# Patient Record
Sex: Female | Born: 1950 | Race: Black or African American | Hispanic: No | Smoking: Never smoker
Health system: Southern US, Community
[De-identification: ages and names within clinical notes are randomized; demographics above are authoritative.]

## PROBLEM LIST (undated history)

## (undated) DIAGNOSIS — E669 Obesity, unspecified: Secondary | ICD-10-CM

## (undated) DIAGNOSIS — M1990 Unspecified osteoarthritis, unspecified site: Secondary | ICD-10-CM

## (undated) DIAGNOSIS — Z87440 Personal history of urinary (tract) infections: Secondary | ICD-10-CM

## (undated) DIAGNOSIS — Z973 Presence of spectacles and contact lenses: Secondary | ICD-10-CM

## (undated) DIAGNOSIS — Z9289 Personal history of other medical treatment: Secondary | ICD-10-CM

## (undated) DIAGNOSIS — Z9889 Other specified postprocedural states: Secondary | ICD-10-CM

## (undated) DIAGNOSIS — J302 Other seasonal allergic rhinitis: Secondary | ICD-10-CM

## (undated) DIAGNOSIS — Z90710 Acquired absence of both cervix and uterus: Secondary | ICD-10-CM

## (undated) DIAGNOSIS — Z90722 Acquired absence of ovaries, bilateral: Secondary | ICD-10-CM

## (undated) DIAGNOSIS — B029 Zoster without complications: Secondary | ICD-10-CM

## (undated) HISTORY — DX: Unspecified osteoarthritis, unspecified site: M19.90

## (undated) HISTORY — DX: Acquired absence of ovaries, bilateral: Z90.722

## (undated) HISTORY — DX: Other specified postprocedural states: Z98.890

## (undated) HISTORY — PX: WISDOM TOOTH EXTRACTION: SHX21

## (undated) HISTORY — DX: Personal history of urinary (tract) infections: Z87.440

## (undated) HISTORY — DX: Obesity, unspecified: E66.9

## (undated) HISTORY — DX: Personal history of other medical treatment: Z92.89

## (undated) HISTORY — DX: Acquired absence of both cervix and uterus: Z90.710

## (undated) HISTORY — DX: Zoster without complications: B02.9

## (undated) HISTORY — DX: Other seasonal allergic rhinitis: J30.2

## (undated) HISTORY — DX: Presence of spectacles and contact lenses: Z97.3

---

## 1983-04-24 DIAGNOSIS — Z90722 Acquired absence of ovaries, bilateral: Secondary | ICD-10-CM

## 1983-04-24 HISTORY — DX: Acquired absence of ovaries, bilateral: Z90.722

## 1983-04-24 HISTORY — PX: VAGINAL HYSTERECTOMY: SUR661

## 1993-04-23 DIAGNOSIS — Z90710 Acquired absence of both cervix and uterus: Secondary | ICD-10-CM

## 1993-04-23 HISTORY — DX: Acquired absence of both cervix and uterus: Z90.710

## 1993-04-23 HISTORY — PX: CARDIAC CATHETERIZATION: SHX172

## 2003-04-24 DIAGNOSIS — Z9889 Other specified postprocedural states: Secondary | ICD-10-CM

## 2003-04-24 HISTORY — DX: Other specified postprocedural states: Z98.890

## 2005-02-16 ENCOUNTER — Ambulatory Visit: Payer: Self-pay | Admitting: Internal Medicine

## 2006-08-05 ENCOUNTER — Ambulatory Visit: Payer: Self-pay | Admitting: Gastroenterology

## 2007-01-28 ENCOUNTER — Emergency Department: Payer: Self-pay | Admitting: Emergency Medicine

## 2007-03-08 ENCOUNTER — Emergency Department: Payer: Self-pay | Admitting: Unknown Physician Specialty

## 2007-03-08 ENCOUNTER — Other Ambulatory Visit: Payer: Self-pay

## 2007-08-26 ENCOUNTER — Ambulatory Visit: Payer: Self-pay

## 2011-01-29 ENCOUNTER — Other Ambulatory Visit: Payer: Managed Care, Other (non HMO)

## 2011-02-05 ENCOUNTER — Encounter: Payer: Self-pay | Admitting: Internal Medicine

## 2011-02-28 ENCOUNTER — Encounter: Payer: Managed Care, Other (non HMO) | Admitting: Internal Medicine

## 2011-03-07 ENCOUNTER — Encounter: Payer: Managed Care, Other (non HMO) | Admitting: Internal Medicine

## 2011-03-31 LAB — HM MAMMOGRAPHY: HM Mammogram: NORMAL

## 2011-04-20 ENCOUNTER — Encounter: Payer: Self-pay | Admitting: Internal Medicine

## 2011-04-20 ENCOUNTER — Ambulatory Visit (INDEPENDENT_AMBULATORY_CARE_PROVIDER_SITE_OTHER): Payer: Managed Care, Other (non HMO) | Admitting: Internal Medicine

## 2011-04-20 DIAGNOSIS — I10 Essential (primary) hypertension: Secondary | ICD-10-CM | POA: Insufficient documentation

## 2011-04-20 DIAGNOSIS — E669 Obesity, unspecified: Secondary | ICD-10-CM

## 2011-04-20 DIAGNOSIS — R5383 Other fatigue: Secondary | ICD-10-CM

## 2011-04-20 DIAGNOSIS — Z1239 Encounter for other screening for malignant neoplasm of breast: Secondary | ICD-10-CM

## 2011-04-20 NOTE — Progress Notes (Signed)
  Subjective:    Patient ID: Sherry Lowe, female    DOB: [birth year], 60 y.o.   MRN: [id number]  HPI  Sherry Lowe is a 60 yr old  AA female with a history of hysterectomy presents with sinus congestion x 2 weeks.  She has clear discharge, no fevers. Taking OTC meds.  Her nasal passages have been and she has noted occasional  Streaks of blood with nose blowing .  Her other complaint is weight gain of 12 lbs in the last year despite cutting out sodas.  She is not exercising.  History reviewed. No pertinent past medical history.  No current outpatient prescriptions on file prior to visit.    Review of Systems  Constitutional: Negative for fever, chills and unexpected weight change.  HENT: Negative for hearing loss, ear pain, nosebleeds, congestion, sore throat, facial swelling, rhinorrhea, sneezing, mouth sores, trouble swallowing, neck pain, neck stiffness, voice change, postnasal drip, sinus pressure, tinnitus and ear discharge.   Eyes: Negative for pain, discharge, redness and visual disturbance.  Respiratory: Negative for cough, chest tightness, shortness of breath, wheezing and stridor.   Cardiovascular: Negative for chest pain, palpitations and leg swelling.  Musculoskeletal: Negative for myalgias and arthralgias.  Skin: Negative for color change and rash.  Neurological: Negative for dizziness, weakness, light-headedness and headaches.  Hematological: Negative for adenopathy.       Objective:   Physical Exam  Constitutional: She is oriented to person, place, and time. She appears well-developed and well-nourished.  HENT:  Mouth/Throat: Oropharynx is clear and moist.  Eyes: EOM are normal. Pupils are equal, round, and reactive to light. No scleral icterus.  Neck: Normal range of motion. Neck supple. No JVD present. No thyromegaly present.  Cardiovascular: Normal rate, regular rhythm, normal heart sounds and intact distal pulses.   Pulmonary/Chest: Effort normal and breath sounds normal.   Abdominal: Soft. Bowel sounds are normal. She exhibits no mass. There is no tenderness.  Musculoskeletal: Normal range of motion. She exhibits no edema.  Lymphadenopathy:    She has no cervical adenopathy.  Neurological: She is alert and oriented to person, place, and time.  Skin: Skin is warm and dry.  Psychiatric: She has a normal mood and affect.          Assessment & Plan:

## 2011-04-20 NOTE — Patient Instructions (Addendum)
The only way  To lose weight and keep it off is to start exercising with a goal of 20 minutes of aeoribic exercise 5 days per week  AND  to eat 6 smaller meals daily instead of 2 or 3.  This boost your metabolism and increases weight loss.  Restrict your starches to once or two servings daily will help  (bread, cereal, Pasta,  potatoes and rice, popcorn)   You should concentrate on salads, green vegetables,  A   lean protein sources  .  Nuts are an excellent source of protein and  fiber.    Dr Vickey Sages makes good protein bars that are  sufficient for one of your 6 meals daily   Drink water or G2 (gatorade without all the sugar)  Or Crystal Light  before a meal to fill up,  And try EAS protein shakes (Carb Control)  Premixed shakes for breakfast or the Atkins protein shakes   Try a sandwich thin toasted, with peanut butter spread on it, or for a sandwhich, find the pita bread or flatbread by [employer].    Return for fasting labs   The maximum weight you should aim for is loss of 4 lbs per month.   Return in 3 months   for your physical

## 2011-04-22 ENCOUNTER — Encounter: Payer: Self-pay | Admitting: Internal Medicine

## 2011-04-22 NOTE — Assessment & Plan Note (Signed)
Will screen for diabetes and thyroid deficiency.  Spent 30 minutes discussing diet and exercise.

## 2011-04-22 NOTE — Assessment & Plan Note (Signed)
Repeat bp was 126 /80.  She has been using OTC decongestants

## 2011-04-24 HISTORY — PX: COLONOSCOPY: SHX174

## 2011-05-04 ENCOUNTER — Other Ambulatory Visit: Payer: Self-pay | Admitting: Internal Medicine

## 2011-05-04 DIAGNOSIS — Z1239 Encounter for other screening for malignant neoplasm of breast: Secondary | ICD-10-CM

## 2011-05-07 ENCOUNTER — Ambulatory Visit
Admission: RE | Admit: 2011-05-07 | Discharge: 2011-05-07 | Disposition: A | Discharge status: Home / Self Care | Payer: Managed Care, Other (non HMO) | Source: Ambulatory Visit | Attending: Internal Medicine | Admitting: Internal Medicine

## 2011-05-07 ENCOUNTER — Inpatient Hospital Stay: Admission: RE | Admit: 2011-05-07 | Payer: Managed Care, Other (non HMO) | Source: Ambulatory Visit

## 2011-05-07 DIAGNOSIS — Z1239 Encounter for other screening for malignant neoplasm of breast: Secondary | ICD-10-CM

## 2011-07-24 ENCOUNTER — Other Ambulatory Visit: Payer: Managed Care, Other (non HMO)

## 2011-08-02 ENCOUNTER — Ambulatory Visit: Payer: Managed Care, Other (non HMO) | Admitting: Internal Medicine

## 2011-10-19 ENCOUNTER — Other Ambulatory Visit (INDEPENDENT_AMBULATORY_CARE_PROVIDER_SITE_OTHER): Payer: Managed Care, Other (non HMO) | Admitting: *Deleted

## 2011-10-19 DIAGNOSIS — E669 Obesity, unspecified: Secondary | ICD-10-CM

## 2011-10-19 DIAGNOSIS — R5383 Other fatigue: Secondary | ICD-10-CM

## 2011-10-19 LAB — LIPID PANEL
Cholesterol: 218 mg/dL — ABNORMAL HIGH (ref 0–200)
HDL: 79.1 mg/dL (ref >39.00)
Total CHOL/HDL Ratio: 3
Triglycerides: 68 mg/dL (ref 0.0–149.0)
VLDL: 13.6 mg/dL (ref 0.0–40.0)

## 2011-10-19 LAB — COMPREHENSIVE METABOLIC PANEL
ALT: 12 U/L (ref 0–35)
AST: 18 U/L (ref 0–37)
Albumin: 3.9 g/dL (ref 3.5–5.2)
Calcium: 9.3 mg/dL (ref 8.4–10.5)
Chloride: 108 mEq/L (ref 96–112)
Potassium: 4.4 mEq/L (ref 3.5–5.1)

## 2011-10-19 LAB — CBC WITH DIFFERENTIAL/PLATELET
Basophils Relative: 1 % (ref 0.0–3.0)
Eosinophils Absolute: 0.1 10*3/uL (ref 0.0–0.7)
HCT: 39.1 % (ref 36.0–46.0)
Lymphs Abs: 1.8 10*3/uL (ref 0.7–4.0)
MCHC: 33 g/dL (ref 30.0–36.0)
MCV: 84.5 fl (ref 78.0–100.0)
Monocytes Absolute: 0.5 10*3/uL (ref 0.1–1.0)
Neutrophils Relative %: 51.8 % (ref 43.0–77.0)
Platelets: 285 10*3/uL (ref 150.0–400.0)
RBC: 4.63 Mil/uL (ref 3.87–5.11)

## 2011-10-22 ENCOUNTER — Other Ambulatory Visit: Payer: Managed Care, Other (non HMO)

## 2011-10-29 ENCOUNTER — Ambulatory Visit (INDEPENDENT_AMBULATORY_CARE_PROVIDER_SITE_OTHER): Payer: Managed Care, Other (non HMO) | Admitting: Internal Medicine

## 2011-10-29 ENCOUNTER — Encounter: Payer: Self-pay | Admitting: Internal Medicine

## 2011-10-29 VITALS — BP 116/74 | HR 72 | Temp 97.6°F | Resp 16 | Wt 214.0 lb

## 2011-10-29 DIAGNOSIS — Z0000 Encounter for general adult medical examination without abnormal findings: Secondary | ICD-10-CM

## 2011-10-29 MED ORDER — FUROSEMIDE 20 MG PO TABS: ORAL_TABLET | ORAL | Status: DC

## 2011-10-29 NOTE — Assessment & Plan Note (Signed)
Breast exam and pelvic exam without PAP was done. Patient is s/p TAH/BSO.  She is up to date on colon ca and breast ca screening.

## 2011-10-29 NOTE — Patient Instructions (Addendum)
I have called in a mild diuretic for you to take AS NEEDED  In the morning for fluid retention.  You may not need it on the weekends.   Try to lose 4 lbs pe rmonth to reach your goal. By cutting out one starch per day  Remember to exercise for 30 minutes 3 to 5 times per week

## 2011-10-29 NOTE — Progress Notes (Signed)
Subjective:    Sherry Lowe is a 61 y.o. female who presents for an annual exam. The patient has no complaints today. The patient is sexually active. GYN screening history: last pap: approximate date 2011 and was normal. The patient wears seatbelts: no. The patient participates in regular exercise: no. Has the patient ever been transfused or tattooed?: no. The patient reports that there is not domestic violence in her life. Reporting lower extremity.   Menstrual History: OB History    Grav Para Term Preterm Abortions TAB SAB Ect Mult Living                  Menarche age: 37  No LMP recorded. Patient has had a hysterectomy.    The following portions of the patient's history were reviewed and updated as appropriate: allergies, current medications, past family history, past medical history, past social history, past surgical history and problem list.  Review of Systems A comprehensive review of systems was negative.    Objective:    BP 116/74  Pulse 72  Temp 97.6 F (36.4 C) (Oral)  Resp 16  Wt 214 lb (97.07 kg)  SpO2 96%  General Appearance:    Alert, cooperative, no distress, appears stated age  Head:    Normocephalic, without obvious abnormality, atraumatic  Eyes:    PERRL, conjunctiva/corneas clear, EOM's intact, fundi    benign, both eyes  Ears:    Normal TM's and external ear canals, both ears  Nose:   Nares normal, septum midline, mucosa normal, no drainage    or sinus tenderness  Throat:   Lips, mucosa, and tongue normal; teeth and gums normal  Neck:   Supple, symmetrical, trachea midline, no adenopathy;    thyroid:  no enlargement/tenderness/nodules; no carotid   bruit or JVD  Back:     Symmetric, no curvature, ROM normal, no CVA tenderness  Lungs:     Clear to auscultation bilaterally, respirations unlabored  Chest Wall:    No tenderness or deformity   Heart:    Regular rate and rhythm, S1 and S2 normal, no murmur, rub   or gallop  Breast Exam:    No tenderness,  masses, or nipple abnormality  Abdomen:     Soft, non-tender, bowel sounds active all four quadrants,    no masses, no organomegaly  Genitalia:    Normal female without lesion, discharge or tenderness  Rectal:    Normal tone, normal prostate, no masses or tenderness;   guaiac negative stool  Extremities:   Extremities normal, atraumatic, no cyanosis or edema  Pulses:   2+ and symmetric all extremities  Skin:   Skin color, texture, turgor normal, no rashes or lesions  Lymph nodes:   Cervical, supraclavicular, and axillary nodes normal  Neurologic:   CNII-XII intact, normal strength, sensation and reflexes    throughout  .    Assessment:    Healthy female exam.    Plan:    Routine general medical examination at a health care facility Breast exam and pelvic exam without PAP was done. Patient is s/p TAH/BSO.  She is up to date on colon ca and breast ca screening.  Breast self exam technique reviewed and patient encouraged to perform self-exam monthly.   Updated Medication List Outpatient Encounter Prescriptions as of 10/29/2011  Medication Sig Dispense Refill  . furosemide (LASIX) 20 MG tablet Daily as needed for fluid retention  30 tablet  3  . DISCONTD: dextromethorphan-guaiFENesin (MUCINEX DM) 30-600 MG per 12 hr tablet  Take 1 tablet by mouth every 12 (twelve) hours.

## 2012-04-23 DIAGNOSIS — B029 Zoster without complications: Secondary | ICD-10-CM

## 2012-04-23 HISTORY — DX: Zoster without complications: B02.9

## 2012-05-29 ENCOUNTER — Other Ambulatory Visit (INDEPENDENT_AMBULATORY_CARE_PROVIDER_SITE_OTHER): Payer: Self-pay | Admitting: General Surgery

## 2012-05-29 ENCOUNTER — Other Ambulatory Visit: Payer: Self-pay | Admitting: Family Medicine

## 2012-05-29 DIAGNOSIS — Z1231 Encounter for screening mammogram for malignant neoplasm of breast: Secondary | ICD-10-CM

## 2012-06-07 ENCOUNTER — Other Ambulatory Visit: Payer: Self-pay

## 2012-07-02 ENCOUNTER — Ambulatory Visit: Payer: Managed Care, Other (non HMO)

## 2013-02-26 ENCOUNTER — Other Ambulatory Visit: Payer: Self-pay

## 2013-03-23 ENCOUNTER — Ambulatory Visit (INDEPENDENT_AMBULATORY_CARE_PROVIDER_SITE_OTHER): Payer: BC Managed Care – PPO | Admitting: Medical

## 2013-03-23 ENCOUNTER — Encounter: Payer: Self-pay | Admitting: Medical

## 2013-03-23 VITALS — BP 120/82 | HR 80 | Temp 98.1°F | Resp 16 | Wt 217.0 lb

## 2013-03-23 DIAGNOSIS — R21 Rash and other nonspecific skin eruption: Secondary | ICD-10-CM

## 2013-03-23 DIAGNOSIS — L259 Unspecified contact dermatitis, unspecified cause: Secondary | ICD-10-CM

## 2013-03-23 MED ORDER — TRIAMCINOLONE ACETONIDE 0.1 % EX CREA: 1.0000 "application " | TOPICAL_CREAM | Freq: Two times a day (BID) | CUTANEOUS | Status: DC

## 2013-03-23 NOTE — Progress Notes (Signed)
  Subjective:   Early Ord is a 62 y.o. female who presents as a new patient today.  Was seeing Dr.Hiltz prior for primary care but he moved to [place] recently.  She is here for evaluation of a rash involving the leg, worried about shingles.  Was in normal state of health until 3 days ago with rash onset.  She does have hx/o chicken pox in remote past.  Rash started 3 days ago. Lesions are pink, and raised in texture. Rash has changed over time. Rash is pruritic. Associated symptoms: none. Patient denies: abdominal pain, arthralgia, congestion, cough, decrease in energy level, fever, headache, irritability, myalgia, nausea and vomiting. Patient has not had contacts with similar rash. Patient has not had new exposures.  Denies any recent illness.    The following portions of the patient's history were reviewed and updated as appropriate: allergies, current medications, past family history, past medical history, past social history and problem list.  Review of Systems As in subjective above   Objective:   Gen: wd, wn, nad Skin: left leg lateral knee joint line with patch of 4 raised papular to slightly vesicular round 2-22mm diameter lesions.  Smaller similar patch of papular/vesicular lesions upper posterolateral region in a different dermatome.   There is a smaller papule with surrounding whealed appearance of left upper anterolateral leg.  No induration, fluctuance, warmth.   MSK: nontender Ext: no edema   Assessment:   Encounter Diagnoses  Name Primary?  . Contact dermatitis Yes  . Rash and nonspecific skin eruption      Plan:   Dr. Joselyn Arrow, supervising physician also examined patient.  Reassured that rash doesn't appear to be shingles given the multiple dermatomes and appearance of the rash.  Discussed symptoms and exam findings.  Etiology most likely contact dermatitis vs insect bite.  Begin benadryl po OTC 2-3 times daily, triamcinolone cream topically, can use topical antibiotic  ointment for worsening redness.  Recheck if not improving in 3-4 days, if worse signs of infection as discussed or new symptoms .  Otherwise return prn.  We will request old chart since she is transferring care.

## 2013-03-27 ENCOUNTER — Ambulatory Visit (INDEPENDENT_AMBULATORY_CARE_PROVIDER_SITE_OTHER): Payer: BC Managed Care – PPO | Admitting: Medical

## 2013-03-27 ENCOUNTER — Encounter: Payer: Self-pay | Admitting: Medical

## 2013-03-27 VITALS — BP 116/74 | HR 60 | Temp 97.8°F | Resp 16 | Wt 219.0 lb

## 2013-03-27 DIAGNOSIS — R209 Unspecified disturbances of skin sensation: Secondary | ICD-10-CM

## 2013-03-27 DIAGNOSIS — R200 Anesthesia of skin: Secondary | ICD-10-CM

## 2013-03-27 DIAGNOSIS — B029 Zoster without complications: Secondary | ICD-10-CM

## 2013-03-27 DIAGNOSIS — R203 Hyperesthesia: Secondary | ICD-10-CM

## 2013-03-27 NOTE — Progress Notes (Signed)
  Subjective:   Sherry Lowe is a 62 y.o. female who presents for recheck. She saw me earlier in the week for a rash.   She came in for a rash of her left lower leg Monday, and after I and Dr. Lynelle Doctor both looked at the rash, it was thought that the rash could just be allergic versus insect bite, although shingles was considered. However, although the rash is now scabbing over, she has numbness in the left lateral foot only times one day.  She denies other numbness, tingling, weakness, no joint pain, muscle pain, no fever, no nausea or vomiting, no diarrhea.  No speech changes, no vision or hearing changes, no chest pain. Denies foot drop. The rash started about 5 days ago, with small blisters along her left lower lateral leg. Since the last visit she has been using the triamcinolone cream topically, Benadryl by mouth. No other complaint.    The following portions of the patient's history were reviewed and updated as appropriate: allergies, current medications, past family history, past medical history, past social history and problem list.  Review of Systems As in subjective above   Objective:   Gen: wd, wn, nad Skin: left leg lateral knee yearly to distal third of the lateral leg with several singular crusted lesions, all seeming to be within S1 dermatome. Some of the lesions are slightly tender to touch, but she seems to be sensitive to even light touch along her lateral lower leg. MSK: Otherwise nontender, no joint swelling Pulses normal lower extremities Ext: no edema Neuro: Decreased sensation to light and sharp touch of her left lateral edge of the left foot only, this is an S1 distribution, otherwise sensation, strength, DTRs normal lower extremities, rest of neurological exam nonfocal   Assessment:   Encounter Diagnoses  Name Primary?  . Shingles outbreak Yes  . Numbness of foot   . Hyperesthesia      Plan:   Dr. Susann Givens supervising physician also examined patient. At this point  it would appear that this is more likely shingles than anything else, particularly given the appearance of the crusting lesions and new neurological findings on exam .  Discussed diagnosis of shingles, usual course of illness, possibility of post viral neuralgia. It is too late to begin antivirals, advise Aleve for the next few days, 2 tablets twice daily, relative rest, can continue triamcinolone cream. Discussed preventative measures, preventing spread of disease, possibility of recurrence, And possible complications. Answered her questions. Followup when necessary.

## 2013-04-07 ENCOUNTER — Telehealth: Payer: Self-pay | Admitting: Medical

## 2013-04-07 ENCOUNTER — Other Ambulatory Visit: Payer: Self-pay | Admitting: Medical

## 2013-04-07 MED ORDER — TRIAMCINOLONE ACETONIDE 0.1 % EX CREA: 1.0000 "application " | TOPICAL_CREAM | Freq: Two times a day (BID) | CUTANEOUS | Status: DC

## 2013-04-07 NOTE — Telephone Encounter (Signed)
rx sent

## 2013-04-07 NOTE — Telephone Encounter (Signed)
Pt needs refill on kenalog. She states she is much better but still has some places that are not completely well. Pt uses [facility] on [address] in 

## 2013-04-07 NOTE — Telephone Encounter (Signed)
Pt informed.lm

## 2013-06-16 ENCOUNTER — Ambulatory Visit: Payer: BC Managed Care – PPO | Admitting: Family Medicine

## 2013-06-19 ENCOUNTER — Ambulatory Visit (INDEPENDENT_AMBULATORY_CARE_PROVIDER_SITE_OTHER): Payer: BC Managed Care – PPO | Admitting: Medical

## 2013-06-19 ENCOUNTER — Encounter: Payer: Self-pay | Admitting: Medical

## 2013-06-19 VITALS — BP 122/80 | HR 80 | Temp 98.0°F | Wt 221.0 lb

## 2013-06-19 DIAGNOSIS — R300 Dysuria: Secondary | ICD-10-CM

## 2013-06-19 DIAGNOSIS — R3915 Urgency of urination: Secondary | ICD-10-CM

## 2013-06-19 LAB — POCT URINALYSIS DIPSTICK
Bilirubin, UA: NEGATIVE
GLUCOSE UA: NEGATIVE
KETONES UA: NEGATIVE
Nitrite, UA: NEGATIVE
Protein, UA: 5
SPEC GRAV UA: 1.01
UROBILINOGEN UA: NEGATIVE
pH, UA: 5

## 2013-06-19 MED ORDER — AMOXICILLIN 875 MG PO TABS: 875.0000 mg | ORAL_TABLET | Freq: Two times a day (BID) | ORAL | Status: DC

## 2013-06-19 NOTE — Patient Instructions (Signed)

## 2013-06-19 NOTE — Progress Notes (Signed)
Subjective:  Sherry Lowe is a 63 y.o. female who complains of possible urinary tract infection.  She has had symptoms for 1 day.  Symptoms include bladder pressure and urgency and frequency.  Patient denies urinary pressure.  Denies fever, burning, blood, no cloudy or odor, no back pain.  Last UTI was over a year ago.   Using cranberry juice for current symptoms.  Patient does have a history of recurrent UTI, on average once yearly.  Patient does not have a history of pyelonephritis.  Has used amoxicillin in the past for UTI. No other aggravating or relieving factors.  No other c/o.  History reviewed. No pertinent past medical history.  ROS as in subjective  Reviewed allergies, medications, past medical, surgical, and social history.    Objective: Filed Vitals:   06/19/13 1105  BP: 122/80  Pulse: 80  Temp: 98 F (36.7 C)    General appearance: alert, no distress, WD/WN, female Abdomen: +bs, soft, non tender, non distended, no masses, no hepatomegaly, no splenomegaly, no bruits Back: no CVA tenderness GU: deferred     Laboratory:  Urine dipstick: moderate leuk, moderate blood, SG: 1.010.      Assessment: Encounter Diagnoses  Name Primary?  . Urinary urgency Yes  . Dysuria      Plan: Discussed symptoms, diagnosis, possible complications, and usual course of illness.  Begin medication Amoxicillin per orders below.  Advised increased water intake, can use OTC Tylenol for pain.        Urine culture sent.  Call or return if worse or not improving.

## 2013-06-22 LAB — URINE CULTURE

## 2013-08-17 ENCOUNTER — Encounter: Payer: BC Managed Care – PPO | Admitting: Medical

## 2013-09-15 ENCOUNTER — Ambulatory Visit (INDEPENDENT_AMBULATORY_CARE_PROVIDER_SITE_OTHER): Payer: BC Managed Care – PPO | Admitting: Medical

## 2013-09-15 ENCOUNTER — Encounter: Payer: Self-pay | Admitting: Medical

## 2013-09-15 VITALS — BP 120/80 | HR 60 | Temp 98.0°F | Resp 14 | Wt 218.0 lb

## 2013-09-15 DIAGNOSIS — R202 Paresthesia of skin: Secondary | ICD-10-CM

## 2013-09-15 DIAGNOSIS — Z8619 Personal history of other infectious and parasitic diseases: Secondary | ICD-10-CM

## 2013-09-15 DIAGNOSIS — M79671 Pain in right foot: Secondary | ICD-10-CM

## 2013-09-15 DIAGNOSIS — M722 Plantar fascial fibromatosis: Secondary | ICD-10-CM

## 2013-09-15 DIAGNOSIS — R209 Unspecified disturbances of skin sensation: Secondary | ICD-10-CM

## 2013-09-15 DIAGNOSIS — M79609 Pain in unspecified limb: Secondary | ICD-10-CM

## 2013-09-15 NOTE — Progress Notes (Signed)
   Subjective:   Sherry Lowe is a 63 y.o. female presenting on 09/15/2013 with bilateral feet pain  She was seen here in December for shingles.  She still has numbness of left foot unchanged since onset of shingles.  No pain thought.  Years ago had to wear brace of left foot, stress fracture, but no numbness til recently.  Having new c/o right heel pain x 10 days, worsening pain.  Pain is mostly back of heel.  Hurts worse when first getting up in the mornings.  The longer she walks on it the better it feels.  No prior similar.  Works in [employer], sits most of the time.   Walks for exercise - 3 times weekly, no heel pain with this so far.  No other aggravating or relieving factors.  No other complaint.  Review of Systems ROS as in subjective      Objective:    Filed Vitals:   09/15/13 1413  BP: 120/80  Pulse: 60  Temp: 98 F (36.7 C)  Resp: 14    General appearance: alert, no distress, WD/WN MSK: right plantar fascia and heel tenderness, otherwise feet nontender, normal ROM of feet and toes, no deformity Pedal pulses normal Neuro: left latera foot with decreased sensation throughout, otherwise feet/leg sensation, strength, and DTRs normal Ext: no edema      Assessment: Encounter Diagnoses  Name Primary?  . Pain of right heel Yes  . Plantar fasciitis of right foot   . Paresthesias   . History of shingles      Plan: Pain of right heel, plantar fascitis of right foot - discussed stretching, ice, foot splint for bedtime.  F/u 20mo  Paresthesias, hx/o shingles - discussed likelihood of this being neuropathy related to shingles infection.  No pain, no obvious post herpetic neuralgia.  Will call back after discussing case with supervising physician.  Sherry Lowe was seen today for bilateral feet pain.  Diagnoses and associated orders for this visit:  Pain of right heel  Plantar fasciitis of right foot  Paresthesias  History of shingles    Return pending  call back.

## 2013-09-15 NOTE — Patient Instructions (Addendum)
  Thank you for giving me the opportunity to serve you today.    Your diagnosis today includes: Encounter Diagnoses  Name Primary?  . Pain of right heel Yes  . Plantar fasciitis of right foot      Specific recommendations today include:  Begin daily foot stretch and tennis ball massage  Consider 90 degree foot splint at bedtime  Can use OTC Aleve or Ibuprofen if needed  I will let you know about the numbness - next steps  Follow up pending call back.   I have included other useful information below for your review.  Plantar Fasciitis Plantar fasciitis is a common condition that causes foot pain. It is soreness (inflammation) of the band of tough fibrous tissue on the bottom of the foot that runs from the heel bone (calcaneus) to the ball of the foot. The cause of this soreness may be from excessive standing, poor fitting shoes, running on hard surfaces, being overweight, having an abnormal walk, or overuse (this is common in runners) of the painful foot or feet. It is also common in aerobic exercise dancers and ballet dancers. SYMPTOMS  Most people with plantar fasciitis complain of:  Severe pain in the morning on the bottom of their foot especially when taking the first steps out of bed. This pain recedes after a few minutes of walking.  Severe pain is experienced also during walking following a long period of inactivity.  Pain is worse when walking barefoot or up stairs DIAGNOSIS   Your caregiver will diagnose this condition by examining and feeling your foot.  Special tests such as X-rays of your foot, are usually not needed. PREVENTION   Consult a sports medicine professional before beginning a new exercise program.  Walking programs offer a good workout. With walking there is a lower chance of overuse injuries common to runners. There is less impact and less jarring of the joints.  Begin all new exercise programs slowly. If problems or pain develop, decrease the  amount of time or distance until you are at a comfortable level.  Wear good shoes and replace them regularly.  Stretch your foot and the heel cords at the back of the ankle (Achilles tendon) both before and after exercise.  Run or exercise on even surfaces that are not hard. For example, asphalt is better than pavement.  Do not run barefoot on hard surfaces.  If using a treadmill, vary the incline.  Do not continue to workout if you have foot or joint problems. Seek professional help if they do not improve. HOME CARE INSTRUCTIONS   Avoid activities that cause you pain until you recover.  Use ice or cold packs on the problem or painful areas after working out.  Only take over-the-counter or prescription medicines for pain, discomfort, or fever as directed by your caregiver.  Soft shoe inserts or athletic shoes with air or gel sole cushions may be helpful.  If problems continue or become more severe, consult a sports medicine caregiver or your own health care provider. Cortisone is a potent anti-inflammatory medication that may be injected into the painful area. You can discuss this treatment with your caregiver. MAKE SURE YOU:   Understand these instructions.  Will watch your condition.  Will get help right away if you are not doing well or get worse. Document Released: 01/02/2001 Document Revised: 07/02/2011 Document Reviewed: 03/03/2008 [facility] Patient Information 2014 [place], [place].

## 2013-09-17 ENCOUNTER — Telehealth: Payer: Self-pay | Admitting: Medical

## 2013-09-17 NOTE — Telephone Encounter (Signed)
After discussing her case with the other doctors here, we do recommend going ahead and getting a nerve conduction study. Please set this up at the hospital, not across the [facility]

## 2013-09-18 NOTE — Telephone Encounter (Signed)
LMOM TO CB. CLS 

## 2013-09-21 ENCOUNTER — Other Ambulatory Visit: Payer: Self-pay | Admitting: Family Medicine

## 2013-09-21 DIAGNOSIS — M722 Plantar fascial fibromatosis: Secondary | ICD-10-CM

## 2013-09-21 DIAGNOSIS — R209 Unspecified disturbances of skin sensation: Secondary | ICD-10-CM

## 2013-09-21 NOTE — Telephone Encounter (Signed)
I am working her referral for nerve conduction study. CLS

## 2013-10-07 ENCOUNTER — Other Ambulatory Visit: Payer: Self-pay | Admitting: Family Medicine

## 2013-10-07 DIAGNOSIS — R209 Unspecified disturbances of skin sensation: Secondary | ICD-10-CM

## 2013-11-09 ENCOUNTER — Ambulatory Visit: Payer: Self-pay | Admitting: Neurology

## 2013-12-02 ENCOUNTER — Ambulatory Visit (INDEPENDENT_AMBULATORY_CARE_PROVIDER_SITE_OTHER): Payer: BC Managed Care – PPO | Admitting: Neurology

## 2013-12-02 ENCOUNTER — Encounter: Payer: Self-pay | Admitting: Neurology

## 2013-12-02 VITALS — BP 138/70 | HR 68 | Temp 98.0°F | Resp 18 | Ht 66.0 in | Wt 221.2 lb

## 2013-12-02 DIAGNOSIS — R209 Unspecified disturbances of skin sensation: Secondary | ICD-10-CM

## 2013-12-02 DIAGNOSIS — R202 Paresthesia of skin: Secondary | ICD-10-CM

## 2013-12-02 NOTE — Patient Instructions (Signed)
1.  We will perform electrodiagnostic testing of the left > right lower extremity to better clarify the nature of your symptoms. 2.  Return to clinic in 802-months to discuss results  ELECTROMYOGRAM AND NERVE CONDUCTION STUDIES (EMG/NCS) INSTRUCTIONS  How to Prepare The neurologist conducting the EMG will need to know if you have certain medical conditions. Tell the neurologist and other EMG lab personnel if you:   Have a pacemaker or any other electrical medical device   Take blood-thinning medications   Have hemophilia, a blood-clotting disorder that causes prolonged bleeding Bathing Take a shower or bath shortly before your exam in order to remove oils from your skin. Don't apply lotions or creams before the exam.  What to Expect You'll likely be asked to change into a hospital gown for the procedure and lie down on an examination table. The following explanations can help you understand what will happen during the exam.    Electrodes. The neurologist or a technician places surface electrodes at various locations on your skin depending on where you're experiencing symptoms. Or the neurologist may insert needle electrodes at different sites depending on your symptoms.    Sensations. The electrodes will at times transmit a tiny electrical current that you may feel as a twinge or spasm. The needle electrode may cause discomfort or pain that usually ends shortly after the needle is removed. If you are concerned about discomfort or pain, you may want to talk to the neurologist about taking a short break during the exam.    Instructions. During the needle EMG, the neurologist will assess whether there is any spontaneous electrical activity when the muscle is at rest - activity that isn't present in healthy muscle tissue - and the degree of activity when you slightly contract the muscle.  He or she will give you instructions on resting and contracting a muscle at appropriate times. Depending on what  muscles and nerves the neurologist is examining, he or she may ask you to change positions during the exam.  After your EMG You may experience some temporary, minor bruising where the needle electrode was inserted into your muscle. This bruising should fade within several days. If it persists, contact your primary care doctor.

## 2013-12-02 NOTE — Progress Notes (Signed)
[facility]rology [facility] Clinic Note - Initial Visit   Date: 12/02/2013  Sherry Lowe MRN: [id number] DOB: [birth year]   Dear Dr. Aleen Campi:  Thank you for your kind referral of Sherry Lowe for consultation of left lateral foot numbness. Although her history is well known to you, please allow [place] to reiterate it for the purpose of our medical record. The patient was accompanied to the clinic by self.   who also provides collateral information.     History of Present Illness: Sherry Lowe is a 63 y.o. right-handed African American female with history of shingles (December 2014) presenting for evaluation of numbness of the left leg.    Around December 2014, she developed a rash over the left buttocks and posterior knee.  Within a few weeks, she developed numbness over the left lateral foot. She does not notice that it is there and it is not bothersome, but symptoms have been persistent since onset without improvement.  Numbness does not involve her lower leg or where her shingles occurred.  She denies any associated tingling or back pain.  No associated numbnessof the right foot.    Past Medical History  Diagnosis Date  . Shingles     Past Surgical History  Procedure Laterality Date  . Total abdominal hysterectomy       Medications:  Current Outpatient Prescriptions on File Prior to Visit  Medication Sig Dispense Refill  . aspirin 81 MG tablet Take 81 mg by mouth daily.       No current facility-administered medications on file prior to visit.    Allergies:  Allergies  Allergen Reactions  . Sulfa Drugs Cross Reactors Itching  . Zithromax [Azithromycin]     Family History: Family History  Problem Relation Age of Onset  . Hyperlipidemia Father     Living, 58  . Cancer Father     prostate  . Hyperlipidemia Mother     Living 55  . Hypertension Father   . Healthy Brother     x2    Social History: History   Social History  . Marital Status:  Single    Spouse Name: N/A    Number of Children: N/A  . Years of Education: N/A   Occupational History  . Not on file.   Social History Main Topics  . Smoking status: Never Smoker   . Smokeless tobacco: Never Used  . Alcohol Use: No  . Drug Use: No  . Sexual Activity: Yes    Partners: Male   Other Topics Concern  . Not on file   Social History Narrative   She lives alone.  She has one grown son.   She works as a Arts development officer as a Acupuncturist.   Highest level of education:  B.S.    Review of Systems:  CONSTITUTIONAL: No fevers, chills, night sweats, or weight loss.   EYES: No visual changes or eye pain ENT: No hearing changes.  No history of nose bleeds.   RESPIRATORY: No cough, wheezing and shortness of breath.   CARDIOVASCULAR: Negative for chest pain, and palpitations.   GI: Negative for abdominal discomfort, blood in stools or black stools.  No recent change in bowel habits.   GU:  No history of incontinence.   MUSCLOSKELETAL: No history of joint pain or swelling.  No myalgias.   SKIN: Negative for lesions, rash, and itching.   HEMATOLOGY/ONCOLOGY: Negative for prolonged bleeding, bruising easily, and swollen nodes.    ENDOCRINE: Negative  for cold or heat intolerance, polydipsia or goiter.   PSYCH:  No depression or anxiety symptoms.   NEURO: As Above.   Vital Signs:  BP 138/70  Pulse 68  Temp(Src) 98 F (36.7 C)  Resp 18  Ht 5\' 6"  (1.676 m)  Wt 221 lb 3.2 oz (100.336 kg)  BMI 35.72 kg/m2 Pain Scale: 0 on a scale of 0-10   General Medical Exam:   General:  Well appearing, comfortable.   Eyes/ENT: see cranial nerve examination.   Neck: No masses appreciated.  Full range of motion without tenderness.  No carotid bruits. Respiratory:  Clear to auscultation, good air entry bilaterally.   Cardiac:  Regular rate and rhythm, no murmur.   Extremities:  No deformities, edema, or skin discoloration. Good capillary refill.   Skin: Old scarred lesions  over the left posterior thigh and popliteal fossa.  Neurological Exam: MENTAL STATUS including orientation to time, place, person, recent and remote memory, attention span and concentration, language, and fund of knowledge is normal.  Speech is not dysarthric.  CRANIAL NERVES: II:  No visual field defects.  Unremarkable fundi.   III-IV-VI: Pupils equal round and reactive to light.  Normal conjugate, extra-ocular eye movements in all directions of gaze.  No nystagmus.  No ptosis.   V:  Normal facial sensation.   VII:  Normal facial symmetry and movements.   VIII:  Normal hearing and vestibular function.   IX-X:  Normal palatal movement.   XI:  Normal shoulder shrug and head rotation.   XII:  Normal tongue strength and range of motion, no deviation or fasciculation.  MOTOR:  No atrophy, fasciculations or abnormal movements.  No pronator drift.  Tone is normal.    Right Upper Extremity:    Left Upper Extremity:    Deltoid  5/5   Deltoid  5/5   Biceps  5/5   Biceps  5/5   Triceps  5/5   Triceps  5/5   Wrist extensors  5/5   Wrist extensors  5/5   Wrist flexors  5/5   Wrist flexors  5/5   Finger extensors  5/5   Finger extensors  5/5   Finger flexors  5/5   Finger flexors  5/5   Dorsal interossei  5/5   Dorsal interossei  5/5   Abductor pollicis  5/5   Abductor pollicis  5/5   Tone ([place] scale)  0  Tone ([place]worth scale)  0   Right Lower Extremity:    Left Lower Extremity:    Hip flexors  5/5   Hip flexors  5/5   Hip extensors  5/5   Hip extensors  5/5   Knee flexors  5/5   Knee flexors  5/5   Knee extensors  5/5   Knee extensors  5/5   Dorsiflexors  5/5   Dorsiflexors  5/5   Plantarflexors  5/5   Plantarflexors  5/5   Toe extensors  5/5   Toe extensors  5/5   Toe flexors  5/5   Toe flexors  5/5   Tone ([place]worth scale)  0  Tone ([place]worth scale)  0   MSRs:  Right  Left brachioradialis 2+  brachioradialis 2+  biceps  2+  biceps 2+  triceps 2+  triceps 2+  patellar 1+  Patellar 1+  ankle jerk 0  ankle jerk 0  Hoffman no  Hoffman no  plantar response down  plantar response down   SENSORY:  Reduced pin prick over the left lateral foot. Otherwise, normal and symmetric perception of light touch, pinprick, vibration, and proprioception.  Romberg's sign absent.   COORDINATION/GAIT: Normal finger-to- nose-finger and heel-to-shin.  Intact rapid alternating movements bilaterally.  Able to rise from a chair without using arms.  Gait narrow based and stable.   Data: Lab Results  Component Value Date   CHOL 218* 10/19/2011   HDL 79.10 10/19/2011   LDLDIRECT 120.7 10/19/2011   TRIG 68.0 10/19/2011   CHOLHDL 3 10/19/2011     IMPRESSION: Ms. Squibb is a delightful 63 year old female with history of shingles affecting the left S1 dermatome presenting with left lateral foot numbness. Her examination shows reduced sensation over the left lateral foot with absent reflexes at the ankles bilaterally. Based on her history and exam, her paresthesias most likely residual effects of shingles. It is atypical that her symptoms are predominantly numbness, since shingles tends to have neuropathic pain associated with it, which she denies.  I will order electrodiagnostic testing to better clarify the nature of her symptoms, since the sural sensory nerve also shares the same cutaneous distribution.   Return to clinic in 2 months.    The duration of this appointment visit was 40 minutes of face-to-face time with the patient.  Greater than 50% of this time was spent in counseling, explanation of diagnosis, planning of further management, and coordination of care.   Thank you for allowing me to participate in patient's care.  If I can answer any additional questions, I would be pleased to do so.    Sincerely,    Donika K. Allena Katz, DO

## 2014-01-18 ENCOUNTER — Ambulatory Visit (INDEPENDENT_AMBULATORY_CARE_PROVIDER_SITE_OTHER): Payer: BC Managed Care – PPO | Admitting: Neurology

## 2014-01-18 DIAGNOSIS — R209 Unspecified disturbances of skin sensation: Secondary | ICD-10-CM

## 2014-01-18 DIAGNOSIS — R202 Paresthesia of skin: Secondary | ICD-10-CM

## 2014-01-18 NOTE — Procedures (Signed)
[facility]  [address], [address]  [place], [place] [address] Tel: [phone] Fax:  [phone] Test Date:  01/18/2014  Patient: Sherry Lowe DOB: [birth year] Physician: Nita Sickle  Sex: Female Height:  Ref Phys: Nita Sickle  ID#: [id number] Temp: 31.0C Technician: Ala Bent R. NCS T.   Patient Complaints: Patient is a 63 year old female for evaluation lateral numbness of left foot since December of 2014.  NCV & EMG Findings: Extensive electrodiagnostic testing of the left lower extremity and additional studies of the right reveals:  1. Bilateral sural and superficial peroneal sensory responses are within normal limits.  2. The left peroneal motor response recording at the extensor digitorum brevis is borderline reduced, however normal when recording at the tibialis anterior. Bilateral tibial and the right peroneal motor responses are within normal limits.  3. Very sparse chronic motor axon loss changes are seen affecting the medial gastrocnemius and biceps femoris short head muscles, without accompanied active changes.  4. H-reflex studies indicate that the left tibial H-reflex has prolonged latency (36.60 ms).     Impression: 1. The electrophysiologic findings are most suggestive of a chronic left S1 radiculopathy, very mild in degree electrically.  2. There is no evidence of a generalized sensorimotor polyneuropathy affecting the legs.   ___________________________ Nita Sickle    Nerve Conduction Studies Anti Sensory Summary Table   Stim Site NR Peak (ms) Norm Peak (ms) P-T Amp (V) Norm P-T Amp  Left Sup Peroneal Anti Sensory (Ant Lat Mall)  12 cm    2.7 <4.6 6.7 >3  Right Sup Peroneal Anti Sensory (Ant Lat Mall)  31C  12 cm    2.7 <4.6 5.9 >3  Left Sural Anti Sensory (Lat Mall)  Calf    4.2 <4.6 3.1 >3  Right Sural Anti Sensory (Lat Mall)  31C  Calf    3.2 <4.6 3.9 >3   Motor Summary Table   Stim Site NR Onset (ms) Norm Onset (ms) O-P  Amp (mV) Norm O-P Amp Site1 Site2 Delta-0 (ms) Dist (cm) Vel (m/s) Norm Vel (m/s)  Left Peroneal Motor (Ext Dig Brev)  31C  Ankle    3.8 <6.0 2.0 >2.5 B Fib Ankle 7.0 32.0 46 >40  B Fib    10.8  1.8  Poplt B Fib 1.8 10.0 56 >40  Poplt    12.6  1.3         Right Peroneal Motor (Ext Dig Brev)  31C  Ankle    3.3 <6.0 3.2 >2.5 B Fib Ankle 6.9 32.5 47 >40  B Fib    10.2  2.5  Poplt B Fib 1.6 8.5 53 >40  Poplt    11.8  2.5         Left Peroneal TA Motor (Tib Ant)  31C  Fib Head    2.5 <4.5 4.9 >3 Poplit Fib Head 1.9 10.0 53 >40  Poplit    4.4  3.7         Right Peroneal TA Motor (Tib Ant)  31C  Fib Head    2.7 <4.5 4.3 >3 Poplit Fib Head 1.3 8.0 62 >40  Poplit    4.0  4.1         Left Tibial Motor (Abd Hall Brev)  31C  Ankle    3.9 <6.0 5.0 >4 Knee Ankle 8.3 36.0 43 >40  Knee    12.2  4.7         Right Tibial Motor (Abd  Hall Brev)  31C  Ankle    3.8 <6.0 4.3 >4 Knee Ankle 7.5 38.0 51 >40  Knee    11.3  3.8          F Wave Studies   NR F-Lat (ms) Lat Norm (ms) L-R F-Lat (ms)  Left Tibial (Mrkrs) (Abd Hallucis)  31C     54.27 <55 4.57  Right Tibial (Mrkrs) (Abd Hallucis)  31C     49.70 <55 4.57   H Reflex Studies   NR H-Lat (ms) Lat Norm (ms) L-R H-Lat (ms)  Left Tibial (Gastroc)  31C     36.60 <35 1.63  Right Tibial (Gastroc)  31C     34.97 <35 1.63   EMG   Side Muscle Ins Act Fibs Psw Fasc Number Recrt Dur Dur. Amp Amp. Poly Poly. Comment  Left AntTibialis Nml Nml Nml Nml Nml Nml Nml Nml Nml Nml Nml Nml N/A  Left Gastroc Nml Nml Nml Nml 1- Mod-V Few 1+ Nml Nml Nml Nml N/A  Left Flex Dig Long Nml Nml Nml Nml Nml Nml Nml Nml Nml Nml Nml Nml N/A  Left RectFemoris Nml Nml Nml Nml Nml Nml Nml Nml Nml Nml Nml Nml N/A  Left BicepsFemS Nml Nml Nml Nml 1- Mod Few 1+ Nml Nml Nml Nml N/A      Waveforms:

## 2014-01-18 NOTE — Progress Notes (Addendum)
EMG 01/18/2014  1. The electrophysiologic findings are most suggestive of a chronic left S1 radiculopathy, very mild in degree electrically.  2. There is no evidence of a generalized sensorimotor polyneuropathy affecting the legs.  Results discussed with patient that numbness is likely from S1 radiculopathy, possibly from shingles nerve irritation.  She will contact us if symptoms change or worsen.  Sherry Lowe K. Allena Katz, DO

## 2014-02-03 ENCOUNTER — Ambulatory Visit: Payer: BC Managed Care – PPO | Admitting: Neurology

## 2014-02-25 ENCOUNTER — Encounter: Payer: Self-pay | Admitting: Medical

## 2014-02-25 ENCOUNTER — Ambulatory Visit (INDEPENDENT_AMBULATORY_CARE_PROVIDER_SITE_OTHER): Payer: BC Managed Care – PPO | Admitting: Medical

## 2014-02-25 ENCOUNTER — Telehealth: Payer: Self-pay | Admitting: Medical

## 2014-02-25 VITALS — BP 130/80 | HR 68 | Temp 97.8°F | Resp 16 | Ht 67.0 in | Wt 218.0 lb

## 2014-02-25 DIAGNOSIS — Z0000 Encounter for general adult medical examination without abnormal findings: Secondary | ICD-10-CM

## 2014-02-25 DIAGNOSIS — Z113 Encounter for screening for infections with a predominantly sexual mode of transmission: Secondary | ICD-10-CM

## 2014-02-25 DIAGNOSIS — Z1239 Encounter for other screening for malignant neoplasm of breast: Secondary | ICD-10-CM

## 2014-02-25 DIAGNOSIS — E669 Obesity, unspecified: Secondary | ICD-10-CM

## 2014-02-25 DIAGNOSIS — Z1211 Encounter for screening for malignant neoplasm of colon: Secondary | ICD-10-CM

## 2014-02-25 DIAGNOSIS — Z2821 Immunization not carried out because of patient refusal: Secondary | ICD-10-CM

## 2014-02-25 DIAGNOSIS — Z23 Encounter for immunization: Secondary | ICD-10-CM

## 2014-02-25 LAB — COMPREHENSIVE METABOLIC PANEL
ALT: 11 U/L (ref 0–35)
AST: 16 U/L (ref 0–37)
Albumin: 4 g/dL (ref 3.5–5.2)
Alkaline Phosphatase: 87 U/L (ref 39–117)
BUN: 14 mg/dL (ref 6–23)
CALCIUM: 9.5 mg/dL (ref 8.4–10.5)
CHLORIDE: 106 meq/L (ref 96–112)
CO2: 24 meq/L (ref 19–32)
CREATININE: 0.88 mg/dL (ref 0.50–1.10)
GLUCOSE: 81 mg/dL (ref 70–99)
Potassium: 4.9 mEq/L (ref 3.5–5.3)
Sodium: 139 mEq/L (ref 135–145)
Total Bilirubin: 0.7 mg/dL (ref 0.2–1.2)
Total Protein: 6.7 g/dL (ref 6.0–8.3)

## 2014-02-25 LAB — CBC WITH DIFFERENTIAL/PLATELET
Basophils Absolute: 0.1 10*3/uL (ref 0.0–0.1)
Basophils Relative: 1 % (ref 0–1)
EOS ABS: 0.1 10*3/uL (ref 0.0–0.7)
EOS PCT: 1 % (ref 0–5)
HCT: 40.4 % (ref 36.0–46.0)
Hemoglobin: 13.2 g/dL (ref 12.0–15.0)
Lymphocytes Relative: 34 % (ref 12–46)
Lymphs Abs: 2.2 10*3/uL (ref 0.7–4.0)
MCH: 27.4 pg (ref 26.0–34.0)
MCHC: 32.7 g/dL (ref 30.0–36.0)
MCV: 84 fL (ref 78.0–100.0)
Monocytes Absolute: 0.5 10*3/uL (ref 0.1–1.0)
Monocytes Relative: 8 % (ref 3–12)
Neutro Abs: 3.6 10*3/uL (ref 1.7–7.7)
Neutrophils Relative %: 56 % (ref 43–77)
PLATELETS: 364 10*3/uL (ref 150–400)
RBC: 4.81 MIL/uL (ref 3.87–5.11)
RDW: 14.6 % (ref 11.5–15.5)
WBC: 6.4 10*3/uL (ref 4.0–10.5)

## 2014-02-25 LAB — POCT URINALYSIS DIPSTICK
Bilirubin, UA: NEGATIVE
Blood, UA: NEGATIVE
GLUCOSE UA: NEGATIVE
Ketones, UA: NEGATIVE
Leukocytes, UA: NEGATIVE
Nitrite, UA: NEGATIVE
Protein, UA: NEGATIVE
Spec Grav, UA: 1.01
Urobilinogen, UA: NEGATIVE
pH, UA: 7

## 2014-02-25 LAB — LIPID PANEL
CHOLESTEROL: 204 mg/dL — AB (ref 0–200)
HDL: 74 mg/dL (ref >39)
LDL Cholesterol: 114 mg/dL — ABNORMAL HIGH (ref 0–99)
TRIGLYCERIDES: 79 mg/dL (ref <150)
Total CHOL/HDL Ratio: 2.8 Ratio
VLDL: 16 mg/dL (ref 0–40)

## 2014-02-25 LAB — TSH: TSH: 1.763 u[IU]/mL (ref 0.350–4.500)

## 2014-02-25 NOTE — Progress Notes (Signed)
Subjective:   HPI  Sherry Lowe is a 63 y.o. female who presents for a complete physical.   Preventative care: Last ophthalmology visit: 2014 [facility] Last dental visit:2015 [place] Last colonoscopy:2013 Last mammogram:2013 Last gynecological exam:2013 Last EAV:WUJWJEKG:years ago Last labs:today  Prior vaccinations: TD or Tdap:unsure Influenza:refusal Pneumococcal:no Shingles/Zostavax:no  Advanced directive:yes Health care power of attorney:yes Living will:yes  Concerns: Saw neurology recently for the foot numbness, and thought to be residual to shingles from 2014.    Cracked a tooth crown, on amoxicillin, seeing dentist soon for repair of crown.   Reviewed their medical, surgical, family, social, medication, and allergy history and updated chart as appropriate.  Past Medical History  Diagnosis Date  . Shingles 2014  . Obesity   . Seasonal allergies   . History of UTI   . Wears glasses   . H/O cardiac catheterization 2005    reportedly normal per patient  . S/P hysterectomy 1995  . History of mammogram 2013    normal per patient  . History of bone density study     2012?    Past Surgical History  Procedure Laterality Date  . Cardiac catheterization  1995    reportedly normal per patient.    . Wisdom tooth extraction    . Colonoscopy  2013    normal per patient  . Vaginal hysterectomy      total, due to fibroids    History   Social History  . Marital Status: Single    Spouse Name: N/A    Number of Children: N/A  . Years of Education: N/A   Occupational History  . Not on file.   Social History Main Topics  . Smoking status: Never Smoker   . Smokeless tobacco: Never Used  . Alcohol Use: No  . Drug Use: No  . Sexual Activity:    Partners: Male   Other Topics Concern  . Not on file   Social History Narrative   She lives alone.  She has one grown son.   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   Highest level of  education:  B.S.   [employer]   Walking 2 x per week as of 02/2014    Family History  Problem Relation Age of Onset  . Hyperlipidemia Father     Living, 1183  . Cancer Father     prostate  . Hypertension Father   . Hyperlipidemia Mother     Living 3181  . Healthy Brother     x2    Current outpatient prescriptions: aspirin 81 MG tablet, Take 81 mg by mouth daily., Disp: , Rfl:   Allergies  Allergen Reactions  . Sulfa Drugs Cross Reactors Itching  . Zithromax [Azithromycin]      Review of Systems Constitutional: -fever, -chills, -sweats, -unexpected weight change, -decreased appetite, -fatigue Allergy: -sneezing, -itching, -congestion Dermatology: +changing moles, --rash, -lumps ENT: -runny nose, -ear pain, -sore throat, -hoarseness, -sinus pain, -teeth pain, - ringing in ears, -hearing loss, -nosebleeds Cardiology: -chest pain, -palpitations, -swelling, -difficulty breathing when lying flat, -waking up short of breath Respiratory: -cough, -shortness of breath, -difficulty breathing with exercise or exertion, -wheezing, -coughing up blood Gastroenterology: -abdominal pain, -nausea, -vomiting, -diarrhea, -constipation, -blood in stool, -changes in bowel movement, -difficulty swallowing or eating Hematology: -bleeding, -bruising  Musculoskeletal: -joint aches, -muscle aches, -joint swelling, -back pain, -neck pain, -cramping, -changes in gait Ophthalmology: denies vision changes, eye redness, itching, discharge Urology: -burning with urination, -  difficulty urinating, -blood in urine, -urinary frequency, -urgency, -incontinence Neurology: -headache, -weakness, -tingling, -numbness, -memory loss, -falls, -dizziness Psychology: -depressed mood, -agitation, -sleep problems     Objective:   Physical Exam  BP 130/80 mmHg  Pulse 68  Temp(Src) 97.8 F (36.6 C) (Oral)  Resp 16  Ht 5\' 7"  (1.702 m)  Wt 218 lb (98.884 kg)  BMI 34.14 kg/m2  General appearance: alert, no distress,  WD/WN, AA female Skin: raised fleshy brown lesion of medial upper eyelid, small round brown 2mm lesion slight raised of lateral right upper eyelid, other scattered macules throughout, no other worrisome lesions HEENT: normocephalic, conjunctiva/corneas normal, sclerae anicteric, PERRLA, EOMi, nares patent, no discharge or erythema, pharynx normal Oral cavity: MMM, tongue normal, teeth in good repair Neck: supple, no lymphadenopathy, no thyromegaly, no masses, normal ROM, no bruits Chest: non tender, normal shape and expansion Heart: RRR, normal S1, S2, no murmurs Lungs: CTA bilaterally, no wheezes, rhonchi, or rales Abdomen: +bs, soft, non tender, non distended, no masses, no hepatomegaly, no splenomegaly, no bruits Back: non tender, normal ROM, no scoliosis Musculoskeletal: upper extremities non tender, no obvious deformity, normal ROM throughout, lower extremities non tender, no obvious deformity, normal ROM throughout Extremities: no edema, no cyanosis, no clubbing Pulses: 2+ symmetric, upper and lower extremities, normal cap refill Neurological: alert, oriented x 3, CN2-12 intact, strength normal upper extremities and lower extremities, sensation normal throughout, DTRs 2+ throughout, no cerebellar signs, gait normal Psychiatric: normal affect, behavior normal, pleasant  Breast: nontender, no masses or lumps, no skin changes, no nipple discharge or inversion, no axillary lymphadenopathy Gyn: Normal external genitalia without lesions, vagina with normal mucosa, s/p hysterctomy, no abnormal vaginal discharge, no masses.   Exam chaperoned by nurse. Rectal: anus normal tone, no mass, occult negative stool   Adult ECG Report  Indication: physical, obesity  Rate: 69 bpm  Rhythm: normal sinus rhythm and sinus arrhythmia  QRS Axis: 4 degrees  PR Interval: 138ms  QRS Duration: 90ms  QTc: 392ms  Conduction Disturbances: none  Other Abnormalities: none  Patient's cardiac risk factors are:  obesity (BMI >= 30 kg/m2) and sedentary lifestyle.  EKG comparison: none  Narrative Interpretation: sinus arrhythmia   Assessment and Plan :    Encounter Diagnoses  Name Primary?  . Annual physical exam Yes  . Obesity   . Screening for breast cancer   . Special screening for malignant neoplasms, colon   . Need for Tdap vaccination   . Refused influenza vaccine   . Screen for STD (sexually transmitted disease)      Physical exam - discussed healthy lifestyle, diet, exercise, preventative care, vaccinations, and addressed their concerns.  Handout given.  Labs today. Counseled on the Tdap (tetanus, diptheria, and acellular pertussis) vaccine.  Vaccine information sheet given. Tdap vaccine given after consent obtained. Refused influenza vaccine today. Obesity - discussed lifestyle changes, needed efforts to lose weight Will schedule mammogram.    We will try to obtain records for prior bone density scan and colonoscopy from within recent few years Refer to dermatology for skin surveillance Osteoporosis risks - menopause age 63yo, sedentary.  Plan for Dexa/bone density age 63yo. Follow-up pending labs

## 2014-02-25 NOTE — Telephone Encounter (Signed)
Check with patient on both issues below first before scheduling.  She has had prior mammogram, and probably wants derm in [place] vs [place]  Set up for mammogram  Refer to dermatology for skin surveillance.

## 2014-02-25 NOTE — Patient Instructions (Signed)
  Thank you for giving me the opportunity to serve you today.    Specific recommendations today include: See your dentist yearly for routine dental care including hygiene visits twice yearly. See your eye doctor yearly for routine vision care. Work on [employer] Grouphealthy diet, exercise and losing weight We updated the Tdap (tetanus, diptheria, and acellular pertussis) vaccine. We will call with lab results  Return pending labs.

## 2014-02-25 NOTE — Telephone Encounter (Signed)
LM to CB [facility] 

## 2014-02-26 LAB — HIV ANTIBODY (ROUTINE TESTING W REFLEX): HIV 1&2 Ab, 4th Generation: NONREACTIVE

## 2014-02-26 LAB — HEMOGLOBIN A1C
Hgb A1c MFr Bld: 5.6 % (ref <5.7)
Mean Plasma Glucose: 114 mg/dL (ref <117)

## 2014-02-26 LAB — RPR

## 2014-02-26 LAB — HEPATITIS C ANTIBODY: HCV AB: NEGATIVE

## 2014-02-27 LAB — GC/CHLAMYDIA PROBE AMP
CT Probe RNA: NEGATIVE
GC PROBE AMP APTIMA: NEGATIVE

## 2014-03-03 ENCOUNTER — Encounter: Payer: Self-pay | Admitting: Medical

## 2014-03-04 NOTE — Telephone Encounter (Signed)
LM to CB

## 2014-03-04 NOTE — Telephone Encounter (Signed)
Appointment with Dr. Elease EtienneLuptons PA Friday, December 11 @ 9:10, patient aware.

## 2014-07-08 ENCOUNTER — Other Ambulatory Visit: Payer: Self-pay

## 2014-07-08 DIAGNOSIS — Z1239 Encounter for other screening for malignant neoplasm of breast: Secondary | ICD-10-CM

## 2014-07-15 ENCOUNTER — Ambulatory Visit
Admission: RE | Admit: 2014-07-15 | Discharge: 2014-07-15 | Disposition: A | Discharge status: Home / Self Care | Payer: BLUE CROSS/BLUE SHIELD | Source: Ambulatory Visit

## 2014-07-15 DIAGNOSIS — Z1239 Encounter for other screening for malignant neoplasm of breast: Secondary | ICD-10-CM

## 2015-08-18 ENCOUNTER — Ambulatory Visit (INDEPENDENT_AMBULATORY_CARE_PROVIDER_SITE_OTHER): Payer: 59 | Admitting: Medical

## 2015-08-18 ENCOUNTER — Encounter: Payer: Self-pay | Admitting: Medical

## 2015-08-18 VITALS — BP 130/82 | HR 78 | Ht 65.25 in | Wt 225.0 lb

## 2015-08-18 DIAGNOSIS — Z0000 Encounter for general adult medical examination without abnormal findings: Secondary | ICD-10-CM

## 2015-08-18 DIAGNOSIS — Z780 Asymptomatic menopausal state: Secondary | ICD-10-CM | POA: Diagnosis not present

## 2015-08-18 DIAGNOSIS — E669 Obesity, unspecified: Secondary | ICD-10-CM | POA: Diagnosis not present

## 2015-08-18 DIAGNOSIS — Z139 Encounter for screening, unspecified: Secondary | ICD-10-CM

## 2015-08-18 DIAGNOSIS — R3129 Other microscopic hematuria: Secondary | ICD-10-CM

## 2015-08-18 DIAGNOSIS — Z7189 Other specified counseling: Secondary | ICD-10-CM

## 2015-08-18 DIAGNOSIS — Z113 Encounter for screening for infections with a predominantly sexual mode of transmission: Secondary | ICD-10-CM | POA: Insufficient documentation

## 2015-08-18 DIAGNOSIS — Z1239 Encounter for other screening for malignant neoplasm of breast: Secondary | ICD-10-CM

## 2015-08-18 DIAGNOSIS — Z7185 Encounter for immunization safety counseling: Secondary | ICD-10-CM

## 2015-08-18 LAB — COMPREHENSIVE METABOLIC PANEL
ALT: 13 U/L (ref 6–29)
AST: 17 U/L (ref 10–35)
Albumin: 4.2 g/dL (ref 3.6–5.1)
Alkaline Phosphatase: 82 U/L (ref 33–130)
BUN: 16 mg/dL (ref 7–25)
CHLORIDE: 105 mmol/L (ref 98–110)
CO2: 26 mmol/L (ref 20–31)
CREATININE: 0.97 mg/dL (ref 0.50–0.99)
Calcium: 9.7 mg/dL (ref 8.6–10.4)
Glucose, Bld: 90 mg/dL (ref 65–99)
Potassium: 4.9 mmol/L (ref 3.5–5.3)
SODIUM: 139 mmol/L (ref 135–146)
Total Bilirubin: 0.6 mg/dL (ref 0.2–1.2)
Total Protein: 6.6 g/dL (ref 6.1–8.1)

## 2015-08-18 LAB — POCT URINALYSIS DIPSTICK
Bilirubin, UA: NEGATIVE
GLUCOSE UA: NEGATIVE
Ketones, UA: NEGATIVE
LEUKOCYTES UA: NEGATIVE
Nitrite, UA: NEGATIVE
Protein, UA: NEGATIVE
SPEC GRAV UA: 1.025
UROBILINOGEN UA: NEGATIVE
pH, UA: 6

## 2015-08-18 LAB — LIPID PANEL
Cholesterol: 217 mg/dL — ABNORMAL HIGH (ref 125–200)
HDL: 89 mg/dL (ref >=46)
LDL CALC: 115 mg/dL (ref <130)
TRIGLYCERIDES: 63 mg/dL (ref <150)
Total CHOL/HDL Ratio: 2.4 Ratio (ref <=5.0)
VLDL: 13 mg/dL (ref <30)

## 2015-08-18 LAB — CBC
HCT: 41.7 % (ref 35.0–45.0)
Hemoglobin: 13.6 g/dL (ref 11.7–15.5)
MCH: 27.9 pg (ref 27.0–33.0)
MCHC: 32.6 g/dL (ref 32.0–36.0)
MCV: 85.6 fL (ref 80.0–100.0)
MPV: 9.4 fL (ref 7.5–12.5)
PLATELETS: 326 10*3/uL (ref 140–400)
RBC: 4.87 MIL/uL (ref 3.80–5.10)
RDW: 14.4 % (ref 11.0–15.0)
WBC: 4.8 10*3/uL (ref 4.0–10.5)

## 2015-08-18 LAB — POCT UA - MICROSCOPIC ONLY: RBC, URINE, MICROSCOPIC: POSITIVE

## 2015-08-18 LAB — HEMOGLOBIN A1C
HEMOGLOBIN A1C: 5.3 % (ref <5.7)
Mean Plasma Glucose: 105 mg/dL

## 2015-08-18 NOTE — Patient Instructions (Signed)
Recommendations:  See your eye doctor yearly for routine vision care.  See your dentist yearly for routine dental care including hygiene visits twice yearly.  Exercise EVERY DAY.  Include weight bearing exercise twice weekly and hearth health exercise such as brisk walking or biking or swimming or other  Eat a healthy low cholesterol low fat diet  Keep active with reading, substitute teaching and other hobbies  Go for mammogram every 1-2 years  Schedule your updated bone density scan  We will call with lab results   Return for PPD test and reading for the school form.

## 2015-08-18 NOTE — Addendum Note (Signed)
Addended by: Kieth BrightlyLAWSON, Torria Fromer M on: 08/18/2015 01:36 PM   Modules accepted: Orders, SmartSet

## 2015-08-18 NOTE — Progress Notes (Signed)
Subjective:   HPI  Sherry Lowe is a 65 y.o. female who presents for a complete physical.   Medical team Sees eye doctor, dentist, dermatologist Aleayah Chico Vincenza Hews, PA-C here for primary care   Reviewed their medical, surgical, family, social, medication, and allergy history and updated chart as appropriate.  Past Medical History  Diagnosis Date  . Shingles 2014  . Obesity   . Seasonal allergies   . History of UTI   . Wears glasses   . H/O cardiac catheterization 2005    reportedly normal per patient  . S/P hysterectomy 1995  . History of mammogram     normal 06/2014  . History of bone density study     2012, pending repeat 07/2015  . H/O bilateral oophorectomy 1985    along with hysterectomy    Past Surgical History  Procedure Laterality Date  . Cardiac catheterization  1995    reportedly normal per patient.    . Wisdom tooth extraction    . Colonoscopy  2013    normal per patient  . Vaginal hysterectomy  1985    total, due to fibroids    Social History   Social History  . Marital Status: Single    Spouse Name: N/A  . Number of Children: N/A  . Years of Education: N/A   Occupational History  . Not on file.   Social History Main Topics  . Smoking status: Never Smoker   . Smokeless tobacco: Never Used  . Alcohol Use: No  . Drug Use: No  . Sexual Activity:    Partners: Male   Other Topics Concern  . Not on file   Social History Narrative   She lives alone.  She has one grown son.   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   Was prior working as a Arts development officer as a Acupuncturist.   Highest level of education:  B.S.   [employer]   Walking a few times per week   As of 07/2015    Family History  Problem Relation Age of Onset  . Hyperlipidemia Father     Living, 39  . Cancer Father     prostate  . Hypertension Father   . Hyperlipidemia Mother     Living 70  . Healthy Brother     x2  . Cancer Maternal Grandmother    stomach  . Stroke Maternal Grandfather      Current outpatient prescriptions:  .  aspirin 81 MG tablet, Take 81 mg by mouth daily., Disp: , Rfl:   Allergies  Allergen Reactions  . Sulfa Drugs Cross Reactors Itching  . Zithromax [Azithromycin]     Review of Systems Constitutional: -fever, -chills, -sweats, -unexpected weight change, -decreased appetite, -fatigue Allergy: -sneezing, -itching, -congestion Dermatology: -changing moles, --rash, -lumps ENT: -runny nose, -ear pain, -sore throat, -hoarseness, -sinus pain, -teeth pain, - ringing in ears, -hearing loss, -nosebleeds Cardiology: -chest pain, -palpitations, -swelling, -difficulty breathing when lying flat, -waking up short of breath Respiratory: -cough, -shortness of breath, -difficulty breathing with exercise or exertion, -wheezing, -coughing up blood Gastroenterology: -abdominal pain, -nausea, -vomiting, -diarrhea, -constipation, -blood in stool, -changes in bowel movement, -difficulty swallowing or eating Hematology: -bleeding, -bruising  Musculoskeletal: -joint aches, -muscle aches, -joint swelling, -back pain, -neck pain, -cramping, -changes in gait Ophthalmology: denies vision changes, eye redness, itching, discharge Urology: -burning with urination, -difficulty urinating, -blood in urine, -urinary frequency, -urgency, -incontinence Neurology: -headache, -weakness, -tingling, -numbness, -memory loss, -falls, -dizziness  Psychology: -depressed mood, -agitation, -sleep problems     Objective:   Physical Exam  BP 130/82 mmHg  Pulse 78  Ht 5' 5.25" (1.657 m)  Wt 225 lb (102.059 kg)  BMI 37.17 kg/m2  General appearance: alert, no distress, WD/WN, AA female Skin: raised fleshy brown lesion of medial upper eyelid, small round brown 2mm lesion slight raised of lateral right upper eyelid, other scattered macules throughout, no other worrisome lesions HEENT: normocephalic, conjunctiva/corneas normal, sclerae anicteric, PERRLA,  EOMi, nares patent, no discharge or erythema, pharynx normal Oral cavity: MMM, tongue normal, teeth in good repair Neck: supple, no lymphadenopathy, no thyromegaly, no masses, normal ROM, no bruits Chest: non tender, normal shape and expansion Heart: RRR, normal S1, S2, no murmurs Lungs: CTA bilaterally, no wheezes, rhonchi, or rales Abdomen: +bs, soft, non tender, non distended, no masses, no hepatomegaly, no splenomegaly, no bruits Back: non tender, normal ROM, no scoliosis Musculoskeletal: upper extremities non tender, no obvious deformity, normal ROM throughout, lower extremities non tender, no obvious deformity, normal ROM throughout Extremities: no edema, no cyanosis, no clubbing Pulses: 2+ symmetric, upper and lower extremities, normal cap refill Neurological: alert, oriented x 3, CN2-12 intact, strength normal upper extremities and lower extremities, sensation normal throughout, DTRs 2+ throughout, no cerebellar signs, gait normal Psychiatric: normal affect, behavior normal, pleasant  Breast: nontender, no masses or lumps, no skin changes, no nipple discharge or inversion, no axillary lymphadenopathy Gyn: Normal external genitalia without lesions, vagina with normal mucosa, s/p hysterectomy, no abnormal vaginal discharge, no masses.   Exam chaperoned by nurse. Rectal: anus normal appearing   Assessment and Plan :    Encounter Diagnoses  Name Primary?  . Encounter for health maintenance examination in adult Yes  . Screening for condition   . Screening for breast cancer   . Vaccine counseling   . Obesity (BMI 30-39.9)   . Post-menopausal    Physical exam - discussed healthy lifestyle, diet, exercise, preventative care, vaccinations, and addressed their concerns.  Handout given.  Labs today. Declines pneumococcal vaccine, advised yearly flu shot in the fall Advised shingles vaccine 2019, which will be 5 years out from shingles outbreak Obesity - discussed lifestyle changes,  needed efforts to lose weight Will schedule mammogram.    She will go for updated bone density scan She will return for PPD placement next week.  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  Up to date on colonoscopy Follow-up pending labs

## 2015-08-19 LAB — MEASLES/MUMPS/RUBELLA IMMUNITY
MUMPS IGG: 130 [AU]/ml — AB (ref <9.00)
Rubella: 22.1 Index — ABNORMAL HIGH (ref <0.90)
Rubeola IgG: >300 AU/mL — ABNORMAL HIGH (ref <25.00)

## 2015-08-19 LAB — HEPATITIS B SURFACE ANTIBODY, QUANTITATIVE: Hepatitis B-Post: 0 m[IU]/mL

## 2015-08-22 ENCOUNTER — Other Ambulatory Visit (INDEPENDENT_AMBULATORY_CARE_PROVIDER_SITE_OTHER): Payer: 59

## 2015-08-22 DIAGNOSIS — Z111 Encounter for screening for respiratory tuberculosis: Secondary | ICD-10-CM | POA: Diagnosis not present

## 2015-08-22 DIAGNOSIS — Z23 Encounter for immunization: Secondary | ICD-10-CM

## 2015-08-24 DIAGNOSIS — Z23 Encounter for immunization: Secondary | ICD-10-CM

## 2015-08-24 LAB — TB SKIN TEST
INDURATION: 0 mm
TB Skin Test: NEGATIVE

## 2015-08-30 ENCOUNTER — Other Ambulatory Visit: Payer: Self-pay

## 2015-08-30 ENCOUNTER — Other Ambulatory Visit: Payer: Self-pay | Admitting: Medical

## 2015-08-30 DIAGNOSIS — Z1231 Encounter for screening mammogram for malignant neoplasm of breast: Secondary | ICD-10-CM

## 2015-08-30 DIAGNOSIS — E559 Vitamin D deficiency, unspecified: Secondary | ICD-10-CM

## 2015-08-31 ENCOUNTER — Other Ambulatory Visit: Payer: Self-pay | Admitting: Medical

## 2015-08-31 DIAGNOSIS — E559 Vitamin D deficiency, unspecified: Secondary | ICD-10-CM

## 2015-08-31 DIAGNOSIS — E2839 Other primary ovarian failure: Secondary | ICD-10-CM

## 2015-09-26 ENCOUNTER — Ambulatory Visit
Admission: RE | Admit: 2015-09-26 | Discharge: 2015-09-26 | Disposition: A | Discharge status: Home / Self Care | Payer: 59 | Source: Ambulatory Visit

## 2015-09-26 ENCOUNTER — Ambulatory Visit
Admission: RE | Admit: 2015-09-26 | Discharge: 2015-09-26 | Disposition: A | Discharge status: Home / Self Care | Payer: 59 | Source: Ambulatory Visit | Attending: Medical | Admitting: Medical

## 2015-09-26 DIAGNOSIS — E559 Vitamin D deficiency, unspecified: Secondary | ICD-10-CM

## 2015-09-26 DIAGNOSIS — Z1231 Encounter for screening mammogram for malignant neoplasm of breast: Secondary | ICD-10-CM

## 2015-09-26 DIAGNOSIS — E2839 Other primary ovarian failure: Secondary | ICD-10-CM

## 2015-09-28 ENCOUNTER — Other Ambulatory Visit (INDEPENDENT_AMBULATORY_CARE_PROVIDER_SITE_OTHER): Payer: 59

## 2015-09-28 DIAGNOSIS — Z23 Encounter for immunization: Secondary | ICD-10-CM | POA: Diagnosis not present

## 2016-02-29 ENCOUNTER — Other Ambulatory Visit: Payer: 59

## 2016-03-05 ENCOUNTER — Other Ambulatory Visit (INDEPENDENT_AMBULATORY_CARE_PROVIDER_SITE_OTHER): Payer: 59

## 2016-03-05 DIAGNOSIS — Z23 Encounter for immunization: Secondary | ICD-10-CM

## 2016-06-21 ENCOUNTER — Ambulatory Visit: Payer: 59 | Admitting: Family Medicine

## 2016-06-22 ENCOUNTER — Ambulatory Visit (INDEPENDENT_AMBULATORY_CARE_PROVIDER_SITE_OTHER): Payer: 59 | Admitting: Family Medicine

## 2016-06-22 ENCOUNTER — Encounter: Payer: Self-pay | Admitting: Family Medicine

## 2016-06-22 VITALS — BP 130/86 | HR 82 | Temp 98.6°F | Wt 226.0 lb

## 2016-06-22 DIAGNOSIS — J0100 Acute maxillary sinusitis, unspecified: Secondary | ICD-10-CM

## 2016-06-22 DIAGNOSIS — J209 Acute bronchitis, unspecified: Secondary | ICD-10-CM | POA: Diagnosis not present

## 2016-06-22 MED ORDER — AMOXICILLIN-POT CLAVULANATE 875-125 MG PO TABS: 1.0000 | ORAL_TABLET | Freq: Two times a day (BID) | ORAL | 0 refills | Status: DC

## 2016-06-22 NOTE — Progress Notes (Signed)
   Subjective:    Patient ID: Sherry Lowe, female    DOB: 03/11/1951, 66 y.o.   MRN: [id number]  HPI 2 weeks ago she had difficulty with sinus congestion, PND, dry cough and the symptoms diminished after one week however on Tuesday of this week they reoccurred with worsening of the cough as well as sinus congestion and slight rhinorrhea. She is also having some mid back pain when she coughs. No underlying allergies and does not smoke. No fever, chills, earache or sore throat.   Review of Systems     Objective:   Physical Exam Alert and in no distress. Nasal mucosa is red with tenderness over ethmoid and maxillary sinuses Tympanic membranes and canals are normal. Pharyngeal area is normal. Neck is supple without adenopathy or thyromegaly. Cardiac exam shows a regular sinus rhythm without murmurs or gallops. Lungs are clear to auscultation.       Assessment & Plan:  Acute bronchitis, unspecified organism - Plan: amoxicillin-clavulanate (AUGMENTIN) 875-125 MG tablet  Acute maxillary sinusitis, recurrence not specified - Plan: amoxicillin-clavulanate (AUGMENTIN) 875-125 MG tablet Recommend Robitussin-DM during the day and NyQuil at night. She will call if not entirely better.

## 2016-06-22 NOTE — Patient Instructions (Signed)
Robitussin-DM during the day or NyQuil at night

## 2016-10-22 ENCOUNTER — Telehealth: Payer: Self-pay | Admitting: Medical

## 2016-10-22 NOTE — Telephone Encounter (Signed)
schedule physical please

## 2016-10-31 NOTE — Telephone Encounter (Signed)
Called pt and left message to call our office to schedule CPE

## 2016-12-20 ENCOUNTER — Encounter: Payer: Self-pay | Admitting: Medical

## 2016-12-20 ENCOUNTER — Ambulatory Visit (INDEPENDENT_AMBULATORY_CARE_PROVIDER_SITE_OTHER): Payer: 59 | Admitting: Medical

## 2016-12-20 VITALS — BP 128/84 | HR 103 | Ht 65.0 in | Wt 227.4 lb

## 2016-12-20 DIAGNOSIS — Z136 Encounter for screening for cardiovascular disorders: Secondary | ICD-10-CM | POA: Diagnosis not present

## 2016-12-20 DIAGNOSIS — Z2820 Immunization not carried out because of patient decision for unspecified reason: Secondary | ICD-10-CM | POA: Diagnosis not present

## 2016-12-20 DIAGNOSIS — Z113 Encounter for screening for infections with a predominantly sexual mode of transmission: Secondary | ICD-10-CM

## 2016-12-20 DIAGNOSIS — Z7189 Other specified counseling: Secondary | ICD-10-CM

## 2016-12-20 DIAGNOSIS — Z0000 Encounter for general adult medical examination without abnormal findings: Secondary | ICD-10-CM

## 2016-12-20 DIAGNOSIS — Z7185 Encounter for immunization safety counseling: Secondary | ICD-10-CM

## 2016-12-20 DIAGNOSIS — E669 Obesity, unspecified: Secondary | ICD-10-CM | POA: Diagnosis not present

## 2016-12-20 LAB — CBC
HEMATOCRIT: 42.4 % (ref 35.0–45.0)
HEMOGLOBIN: 13.6 g/dL (ref 11.7–15.5)
MCH: 27.7 pg (ref 27.0–33.0)
MCHC: 32.1 g/dL (ref 32.0–36.0)
MCV: 86.4 fL (ref 80.0–100.0)
MPV: 9.3 fL (ref 7.5–12.5)
Platelets: 366 10*3/uL (ref 140–400)
RBC: 4.91 MIL/uL (ref 3.80–5.10)
RDW: 14.3 % (ref 11.0–15.0)
WBC: 5.3 10*3/uL (ref 4.0–10.5)

## 2016-12-20 LAB — POCT URINALYSIS DIP (PROADVANTAGE DEVICE)
BILIRUBIN UA: NEGATIVE
GLUCOSE UA: NEGATIVE mg/dL
Ketones, POC UA: NEGATIVE mg/dL
LEUKOCYTES UA: NEGATIVE
NITRITE UA: NEGATIVE
Protein Ur, POC: NEGATIVE mg/dL
RBC UA: NEGATIVE
Specific Gravity, Urine: 1.025
UUROB: NEGATIVE
pH, UA: 6 (ref 5.0–8.0)

## 2016-12-20 NOTE — Progress Notes (Signed)
Subjective:   HPI  Sherry Lowe is a 66 y.o. female who presents for a complete physical.   Medical team Sees eye doctor, dentist, dermatologist Isabella Ida, Kermit Baloavid S, PA-C here for primary care   Reviewed their medical, surgical, family, social, medication, and allergy history and updated chart as appropriate.  Past Medical History:  Diagnosis Date  . H/O bilateral oophorectomy 1985   along with hysterectomy  . H/O cardiac catheterization 2005   reportedly normal per patient  . History of bone density study    2012, 2017 normal  . History of UTI   . Obesity   . S/P hysterectomy 1995  . Seasonal allergies   . Shingles 2014  . Wears glasses     Past Surgical History:  Procedure Laterality Date  . CARDIAC CATHETERIZATION  1995   reportedly normal per patient.    . COLONOSCOPY  2013   normal per patient  . VAGINAL HYSTERECTOMY  1985   total, due to fibroids  . WISDOM TOOTH EXTRACTION      Social History   Social History  . Marital status: Single    Spouse name: N/A  . Number of children: N/A  . Years of education: N/A   Occupational History  . Not on file.   Social History Main Topics  . Smoking status: Never Smoker  . Smokeless tobacco: Never Used  . Alcohol use No  . Drug use: No  . Sexual activity: Yes    Partners: Male   Other Topics Concern  . Not on file   Social History Narrative   She lives alone.  She has one grown son.   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   Highest level of education:  B.S.   [employer]   Walking a few times per week   As of 11/2016.    Family History  Problem Relation Age of Onset  . Hyperlipidemia Father        Living, 7883  . Cancer Father        prostate  . Hypertension Father   . Hyperlipidemia Mother        Living 2781  . Healthy Brother        x2  . Cancer Maternal Grandmother        stomach  . Stroke Maternal Grandfather       Current Outpatient Prescriptions:  .  aspirin 81 MG tablet, Take 81 mg by mouth daily., Disp: , Rfl:   Allergies  Allergen Reactions  . Sulfa Drugs Cross Reactors Itching  . Zithromax [Azithromycin]     Review of Systems Constitutional: -fever, -chills, -sweats, -unexpected weight change, -decreased appetite, -fatigue Allergy: -sneezing, -itching, -congestion Dermatology: -changing moles, --rash, -lumps ENT: -runny nose, -ear pain, -sore throat, -hoarseness, -sinus pain, -teeth pain, - ringing in ears, -hearing loss, -nosebleeds Cardiology: -chest pain, -palpitations, -swelling, -difficulty breathing when lying flat, -waking up short of breath Respiratory: -cough, -shortness of breath, -difficulty breathing with exercise or exertion, -wheezing, -coughing up blood Gastroenterology: -abdominal pain, -nausea, -vomiting, -diarrhea, -constipation, -blood in stool, -changes in bowel movement, -difficulty swallowing or eating Hematology: -bleeding, -bruising  Musculoskeletal: -joint aches, -muscle aches, -joint swelling, -back pain, -neck pain, -cramping, -changes in gait Ophthalmology: denies vision changes, eye redness, itching, discharge Urology: -burning with urination, -difficulty urinating, -blood in urine, -urinary frequency, -urgency, -incontinence Neurology: -headache, -weakness, -tingling, -numbness, -memory loss, -falls, -dizziness  Psychology: -depressed mood, -agitation, -sleep problems     Objective:   Physical Exam  BP 128/84   Pulse (!) 103   Ht 5\' 5"  (1.651 m)   Wt 227 lb 6.4 oz (103.1 kg)   SpO2 98%   BMI 37.84 kg/m   General appearance: alert, no distress, WD/WN, AA female Skin: raised fleshy brown lesion of medial upper eyelid, small round brown 2mm lesion slight raised of lateral right upper eyelid, other scattered macules throughout, no other worrisome lesions HEENT: normocephalic, conjunctiva/corneas normal, sclerae anicteric, PERRLA, EOMi, nares patent,  no discharge or erythema, pharynx normal Oral cavity: MMM, tongue normal, teeth in good repair Neck: supple, no lymphadenopathy, no thyromegaly, no masses, normal ROM, no bruits Chest: non tender, normal shape and expansion Heart: RRR, normal S1, S2, no murmurs Lungs: CTA bilaterally, no wheezes, rhonchi, or rales Abdomen: +bs, soft, non tender, non distended, no masses, no hepatomegaly, no splenomegaly, no bruits Back: non tender, normal ROM, no scoliosis Musculoskeletal: upper extremities non tender, no obvious deformity, normal ROM throughout, lower extremities non tender, no obvious deformity, normal ROM throughout Extremities: no edema, no cyanosis, no clubbing Pulses: 2+ symmetric, upper and lower extremities, normal cap refill Neurological: alert, oriented x 3, CN2-12 intact, strength normal upper extremities and lower extremities, sensation normal throughout, DTRs 2+ throughout, no cerebellar signs, gait normal Psychiatric: normal affect, behavior normal, pleasant  Breast, gyn, rectal - deferred   Adult ECG Report  Indication: physical  Rate: 65 bpm  Rhythm: normal sinus rhythm  QRS Axis: 3 degrees  PR Interval: 138 ms  QRS Duration: 92ms  QTc:  Conduction Disturbances: none  Other Abnormalities: none  Patient's cardiac risk factors are: obesity (BMI >= 30 kg/m2).  EKG comparison: 2015  Narrative Interpretation: no acute changes    Assessment and Plan :    Encounter Diagnoses  Name Primary?  . Routine general medical examination at a health care facility Yes  . Screen for STD (sexually transmitted disease)   . Obesity (BMI 30-39.9)   . Vaccine counseling   . Vaccine refused by patient    Physical exam - discussed healthy lifestyle, diet, exercise, preventative care, vaccinations, and addressed their concerns. Labs today. Declines pneumococcal vaccine, declines flu shot. Advised shingles vaccine 2019, which will be 5 years out from shingles outbreak Obesity -  discussed lifestyle changes, needed efforts to lose weight Reviewed 2017 normal bone density scan and 2018 mammogram She is s/p hysterectomy.  Up to date on colonoscopy Follow-up pending labs

## 2016-12-21 LAB — BASIC METABOLIC PANEL
BUN: 14 mg/dL (ref 7–25)
CO2: 21 mmol/L (ref 20–32)
Calcium: 9.6 mg/dL (ref 8.6–10.4)
Chloride: 106 mmol/L (ref 98–110)
Creat: 0.95 mg/dL (ref 0.50–0.99)
GLUCOSE: 84 mg/dL (ref 65–99)
POTASSIUM: 4.6 mmol/L (ref 3.5–5.3)
SODIUM: 141 mmol/L (ref 135–146)

## 2016-12-21 LAB — GC/CHLAMYDIA PROBE AMP
CT PROBE, AMP APTIMA: NOT DETECTED
GC PROBE AMP APTIMA: NOT DETECTED

## 2016-12-21 LAB — RPR

## 2016-12-21 LAB — HEMOGLOBIN A1C
Hgb A1c MFr Bld: 5.2 % (ref <5.7)
MEAN PLASMA GLUCOSE: 103 mg/dL

## 2016-12-21 LAB — HIV ANTIBODY (ROUTINE TESTING W REFLEX): HIV: NONREACTIVE

## 2016-12-25 DIAGNOSIS — Z1211 Encounter for screening for malignant neoplasm of colon: Secondary | ICD-10-CM | POA: Insufficient documentation

## 2017-05-08 ENCOUNTER — Encounter: Payer: Self-pay | Admitting: Medical

## 2017-05-08 ENCOUNTER — Ambulatory Visit (INDEPENDENT_AMBULATORY_CARE_PROVIDER_SITE_OTHER): Payer: PPO | Admitting: Medical

## 2017-05-08 VITALS — BP 122/80 | HR 92 | Temp 98.2°F | Wt 225.8 lb

## 2017-05-08 DIAGNOSIS — R05 Cough: Secondary | ICD-10-CM | POA: Diagnosis not present

## 2017-05-08 DIAGNOSIS — R6889 Other general symptoms and signs: Secondary | ICD-10-CM | POA: Diagnosis not present

## 2017-05-08 DIAGNOSIS — R059 Cough, unspecified: Secondary | ICD-10-CM

## 2017-05-08 NOTE — Addendum Note (Signed)
Addended by: Jac CanavanYSINGER, DAVID S on: 05/08/2017 10:29 AM   Modules accepted: Level of Service

## 2017-05-08 NOTE — Progress Notes (Signed)
Subjective: Chief Complaint  Patient presents with  . coughing congestion    coughing , congestion  started saturday , had a fever    Here for 4 day hx/o stuffy nose, cough, congestion, achy in chest, can't get anything out with cough, like mucous in throat can't get out.   Bad taste in mouth.   Has had some achiness in chest and thighs.   Has had low grade fever Sunday 4 days ago, up to 101.3.  No chills.  Some scratchy throat, no ear pain.   Has had some headaches.   No sick contacts.  Using Nyquil cold and flu.  No other aggravating or relieving factors. No other complaint.  Past Medical History:  Diagnosis Date  . H/O bilateral oophorectomy 1985   along with hysterectomy  . H/O cardiac catheterization 2005   reportedly normal per patient  . History of bone density study    2012, 2017 normal  . History of UTI   . Obesity   . S/P hysterectomy 1995  . Seasonal allergies   . Shingles 2014  . Wears glasses    Current Outpatient Medications on File Prior to Visit  Medication Sig Dispense Refill  . aspirin 81 MG tablet Take 81 mg by mouth daily.     No current facility-administered medications on file prior to visit.    ROS as in subjective   Objective: BP 122/80   Pulse 92   Temp 98.2 F (36.8 C)   Wt 225 lb 12.8 oz (102.4 kg)   SpO2 96%   BMI 37.58 kg/m   General appearance: alert, no distress, WD/WN, mildly ill appearing HEENT: normocephalic, sclerae anicteric, conjunctiva pink and moist, TMs pearly, nares patent, no discharge , mild erythema, pharynx with mild erythema, tonsils unremarkable Oral cavity: MMM, no lesions Neck: supple, no lymphadenopathy, no thyromegaly, no masses Heart: RRR, normal S1, S2, no murmurs Lungs: CTA bilaterally, no wheezes, rhonchi, or rales Pulses: 2+ symmetric      Assessment: Encounter Diagnoses  Name Primary?  . Cough Yes  . Flu-like symptoms      Plan: Discussed symptoms and exam findings.  Symptoms suggest either mild  fleeting illness or viral upper respiratory illness.  At this point recommended supportive care, rest, hydration.  If symptoms worsen in the next few days she can call back particularly if fever or worsening wet cough or productive cough.

## 2017-05-08 NOTE — Patient Instructions (Signed)
Thank you for giving me the opportunity to serve you today.   Your diagnosis today includes: Encounter Diagnoses  Name Primary?  . Cough Yes  . Flu-like symptoms    If significantly worse in the next few days such as fever over 101, wet rattly cough, shortness of breath or much worse, then call back.  I would expect your symptoms to gradually improve through the weekend   Specific home care recommendations today include:  You may use OTC Guaifenesin (Mucinex plain) for congestion.  You can continue the Nyquil at night.  Sore throat remedies:  You may use salt water gargles, warm fluids such as coffee or hot tea, or honey/tea/lemon mixture to sooth sore throat pain.  You may use OTC sore throat remedies such as Cepacol lozenges or Chloraseptic spray for sore throat pain.  Runny nose and sneezing remedies: You may use OTC antihistamine such as Zyrtec or Benadryl, but caution as these can cause drowsiness.    Pain/fever relief: You may use over-the-counter Tylenol for pain or fever  Drink extra fluids. Fluids help thin the mucus so your sinuses can drain more easily.   Applying either moist heat or ice packs to the sinus areas may help relieve discomfort.  Use saline nasal sprays to help moisten your sinuses. The sprays can be found at your local drugstore.    Please call or return if worse or not improving in the next few days.     I have included other useful information below for your review.   Upper Respiratory Infection, Adult An upper respiratory infection (URI) is also known as the common cold. It is often caused by a type of germ (virus). Colds are easily spread (contagious). You can pass it to others by kissing, coughing, sneezing, or drinking out of the same glass. Usually, you get better in 1 or 2 weeks.  HOME CARE   Only take medicine as told by your doctor.   Use a warm mist humidifier or breathe in steam from a hot shower.   Drink enough water and fluids to  keep your pee (urine) clear or pale yellow.   Get plenty of rest.   Return to work when your temperature is back to normal or as told by your doctor. You may use a face mask and wash your hands to stop your cold from spreading.  GET HELP RIGHT AWAY IF:   After the first few days, you feel you are getting worse.   You have questions about your medicine.   You have chills, shortness of breath, or brown or red spit (mucus).   You have yellow or brown snot (nasal discharge) or pain in the face, especially when you bend forward.   You have a fever, puffy (swollen) neck, pain when you swallow, or white spots in the back of your throat.   You have a bad headache, ear pain, sinus pain, or chest pain.   You have a high-pitched whistling sound when you breathe in and out (wheezing).   You have a lasting cough or cough up blood.   You have sore muscles or a stiff neck.  MAKE SURE YOU:   Understand these instructions.   Will watch your condition.   Will get help right away if you are not doing well or get worse.  Document Released: 09/26/2007 Document Revised: 12/20/2010 Document Reviewed: 08/14/2010 [facility] Patient Information 2012 [place], [place].

## 2018-03-13 ENCOUNTER — Other Ambulatory Visit: Payer: Self-pay | Admitting: Medical

## 2018-03-13 DIAGNOSIS — Z1231 Encounter for screening mammogram for malignant neoplasm of breast: Secondary | ICD-10-CM

## 2018-04-04 ENCOUNTER — Ambulatory Visit: Payer: 59

## 2018-04-14 ENCOUNTER — Ambulatory Visit: Payer: PPO | Admitting: Medical

## 2018-06-05 ENCOUNTER — Encounter: Payer: PPO | Admitting: Medical

## 2018-06-11 ENCOUNTER — Ambulatory Visit
Admission: RE | Admit: 2018-06-11 | Discharge: 2018-06-11 | Disposition: A | Discharge status: Home / Self Care | Payer: BC Managed Care – PPO | Source: Ambulatory Visit | Attending: Medical | Admitting: Medical

## 2018-06-11 DIAGNOSIS — Z1231 Encounter for screening mammogram for malignant neoplasm of breast: Secondary | ICD-10-CM

## 2018-06-13 ENCOUNTER — Ambulatory Visit: Payer: Self-pay

## 2018-06-20 ENCOUNTER — Encounter: Payer: Self-pay | Admitting: Medical

## 2018-06-20 ENCOUNTER — Ambulatory Visit (INDEPENDENT_AMBULATORY_CARE_PROVIDER_SITE_OTHER): Payer: BC Managed Care – PPO | Admitting: Medical

## 2018-06-20 VITALS — BP 130/74 | HR 72 | Temp 98.0°F | Resp 16 | Ht 65.0 in | Wt 230.0 lb

## 2018-06-20 DIAGNOSIS — Z1211 Encounter for screening for malignant neoplasm of colon: Secondary | ICD-10-CM

## 2018-06-20 DIAGNOSIS — Z13220 Encounter for screening for lipoid disorders: Secondary | ICD-10-CM

## 2018-06-20 DIAGNOSIS — Z7189 Other specified counseling: Secondary | ICD-10-CM

## 2018-06-20 DIAGNOSIS — E669 Obesity, unspecified: Secondary | ICD-10-CM

## 2018-06-20 DIAGNOSIS — Z7185 Encounter for immunization safety counseling: Secondary | ICD-10-CM

## 2018-06-20 DIAGNOSIS — Z113 Encounter for screening for infections with a predominantly sexual mode of transmission: Secondary | ICD-10-CM

## 2018-06-20 DIAGNOSIS — Z2820 Immunization not carried out because of patient decision for unspecified reason: Secondary | ICD-10-CM

## 2018-06-20 DIAGNOSIS — Z0000 Encounter for general adult medical examination without abnormal findings: Secondary | ICD-10-CM | POA: Diagnosis not present

## 2018-06-20 LAB — POCT URINALYSIS DIP (PROADVANTAGE DEVICE)
Bilirubin, UA: NEGATIVE
Glucose, UA: NEGATIVE mg/dL
Ketones, POC UA: NEGATIVE mg/dL
Leukocytes, UA: NEGATIVE
Nitrite, UA: NEGATIVE
PROTEIN UA: NEGATIVE mg/dL
Specific Gravity, Urine: 1.03
Urobilinogen, Ur: NEGATIVE
pH, UA: 6 (ref 5.0–8.0)

## 2018-06-20 NOTE — Patient Instructions (Signed)
Preventive Care 68 Years and Older, Female Preventive care refers to lifestyle choices and visits with your health care provider that can promote health and wellness. What does preventive care include?  A yearly physical exam. This is also called an annual well check.  Dental exams once or twice a year.  Routine eye exams. Ask your health care provider how often you should have your eyes checked.  Personal lifestyle choices, including: ? Daily care of your teeth and gums. ? Regular physical activity. ? Eating a healthy diet. ? Avoiding tobacco and drug use. ? Limiting alcohol use. ? Practicing safe sex. ? Taking low-dose aspirin every day. ? Taking vitamin and mineral supplements as recommended by your health care provider. What happens during an annual well check? The services and screenings done by your health care provider during your annual well check will depend on your age, overall health, lifestyle risk factors, and family history of disease. Counseling Your health care provider may ask you questions about your:  Alcohol use.  Tobacco use.  Drug use.  Emotional well-being.  Home and relationship well-being.  Sexual activity.  Eating habits.  History of falls.  Memory and ability to understand (cognition).  Work and work Statistician.  Reproductive health.  Screening You may have the following tests or measurements:  Height, weight, and BMI.  Blood pressure.  Lipid and cholesterol levels. These may be checked every 5 years, or more frequently if you are over 30 years old.  Skin check.  Lung cancer screening. You may have this screening every year starting at age 68 if you have a 30-pack-year history of smoking and currently smoke or have quit within the past 15 years.  Colorectal cancer screening. All adults should have this screening starting at age 68 and continuing until age 46. You will have tests every 1-10 years, depending on your results and the  type of screening test. People at increased risk should start screening at an earlier age. Screening tests may include: ? Guaiac-based fecal occult blood testing. ? Fecal immunochemical test (FIT). ? Stool DNA test. ? Virtual colonoscopy. ? Sigmoidoscopy. During this test, a flexible tube with a tiny camera (sigmoidoscope) is used to examine your rectum and lower colon. The sigmoidoscope is inserted through your anus into your rectum and lower colon. ? Colonoscopy. During this test, a long, thin, flexible tube with a tiny camera (colonoscope) is used to examine your entire colon and rectum.  Hepatitis C blood test.  Hepatitis B blood test.  Sexually transmitted disease (STD) testing.  Diabetes screening. This is done by checking your blood sugar (glucose) after you have not eaten for a while (fasting). You may have this done every 1-3 years.  Bone density scan. This is done to screen for osteoporosis. You may have this done starting at age 68.  Mammogram. This may be done every 1-2 years. Talk to your health care provider about how often you should have regular mammograms. Talk with your health care provider about your test results, treatment options, and if necessary, the need for more tests. Vaccines Your health care provider may recommend certain vaccines, such as:  Influenza vaccine. This is recommended every year.  Tetanus, diphtheria, and acellular pertussis (Tdap, Td) vaccine. You may need a Td booster every 10 years.  Varicella vaccine. You may need this if you have not been vaccinated.  Zoster vaccine. You may need this after age 68.  Measles, mumps, and rubella (MMR) vaccine. You may need at least  one dose of MMR if you were born in 1957 or later. You may also need a second dose.  Pneumococcal 13-valent conjugate (PCV13) vaccine. One dose is recommended after age 68.  Pneumococcal polysaccharide (PPSV23) vaccine. One dose is recommended after age 68.  Meningococcal  vaccine. You may need this if you have certain conditions.  Hepatitis A vaccine. You may need this if you have certain conditions or if you travel or work in places where you may be exposed to hepatitis A.  Hepatitis B vaccine. You may need this if you have certain conditions or if you travel or work in places where you may be exposed to hepatitis B.  Haemophilus influenzae type b (Hib) vaccine. You may need this if you have certain conditions. Talk to your health care provider about which screenings and vaccines you need and how often you need them. This information is not intended to replace advice given to you by your health care provider. Make sure you discuss any questions you have with your health care provider. Document Released: 05/06/2015 Document Revised: 05/30/2017 Document Reviewed: 02/08/2015 Elsevier Interactive Patient Education  2019 [employer].

## 2018-06-20 NOTE — Progress Notes (Signed)
Subjective:    Sherry Lowe is a 68 y.o. female who presents for Preventative Services visit and chronic medical problems/med check visit.    Primary Care Provider Demeshia Sherburne, Kermit Balo, PA-C here for primary care  Current Health Care Team:  Dentist, Dr. Janne Napoleon doctor, [facility]. [facility]  Medical Services you may have received from other than [facility] providers in the past year (date may be approximate) none  Exercise Current exercise habits:  Walk once a week   Nutrition/Diet Current diet: normal   Depression Screen Depression screen [facility] 2/9 06/20/2018  Decreased Interest 0  Down, Depressed, Hopeless 0  PHQ - 2 Score 0    Activities of Daily Living Screen/Functional Status Survey Is the patient deaf or have difficulty hearing?: No Does the patient have difficulty seeing, even when wearing glasses/contacts?: No Does the patient have difficulty concentrating, remembering, or making decisions?: No Does the patient have difficulty walking or climbing stairs?: No Does the patient have difficulty dressing or bathing?: No Does the patient have difficulty doing errands alone such as visiting a doctor's office or shopping?: No  Can patient draw a clock face showing 3:15 oclock, yes    Fall Risk Screen Fall Risk  06/20/2018 12/20/2016  Falls in the past year? 1 No  Number falls in past yr: 0 -  Injury with Fall? 0 -  Follow up Falls evaluation completed -  slipped going out her church in 03/2018, no residual problems from this.  Gait Assessment: Normal gait observed yes  Advanced directives Does patient have a Health Care Power of Attorney? Yes  Does patient have a Living Will? Yes   Past Medical History:  Diagnosis Date  . H/O bilateral oophorectomy 1985   along with hysterectomy  . H/O cardiac catheterization 2005   reportedly normal per patient  . History of bone density study    2012, 2017 normal  . History of UTI   . Obesity   . S/P hysterectomy 1995  .  Seasonal allergies   . Shingles 2014  . Wears glasses     Past Surgical History:  Procedure Laterality Date  . CARDIAC CATHETERIZATION  1995   reportedly normal per patient.    . COLONOSCOPY  2013   normal per patient  . VAGINAL HYSTERECTOMY  1985   total, due to fibroids  . WISDOM TOOTH EXTRACTION      Social History   Socioeconomic History  . Marital status: Divorced    Spouse name: Not on file  . Number of children: Not on file  . Years of education: Not on file  . Highest education level: Not on file  Occupational History  . Not on file  Social Needs  . Financial resource strain: Not on file  . Food insecurity:    Worry: Not on file    Inability: Not on file  . Transportation needs:    Medical: Not on file    Non-medical: Not on file  Tobacco Use  . Smoking status: Never Smoker  . Smokeless tobacco: Never Used  Substance and Sexual Activity  . Alcohol use: No  . Drug use: No  . Sexual activity: Yes    Partners: Male  Lifestyle  . Physical activity:    Days per week: Not on file    Minutes per session: Not on file  . Stress: Not on file  Relationships  . Social connections:    Talks on phone: Not on file  Gets together: Not on file    Attends religious service: Not on file    Active member of club or organization: Not on file    Attends meetings of clubs or organizations: Not on file    Relationship status: Not on file  . Intimate partner violence:    Fear of current or ex partner: Not on file    Emotionally abused: Not on file    Physically abused: Not on file    Forced sexual activity: Not on file  Other Topics Concern  . Not on file  Social History Narrative   She lives alone.  She has one grown son.   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   Was prior working as a Arts development officer as a Acupuncturist.  Doing some work at [employer] level of education:  B.S.   [employer] a few times per  week   As of 05/2018    Family History  Problem Relation Age of Onset  . Hyperlipidemia Father        Living, 54  . Cancer Father        prostate  . Hypertension Father   . Hyperlipidemia Mother        Living 29  . Healthy Brother        x2  . Cancer Maternal Grandmother        stomach  . Stroke Maternal Grandfather      Current Outpatient Medications:  .  aspirin 81 MG tablet, Take 81 mg by mouth daily., Disp: , Rfl:   Allergies  Allergen Reactions  . Sulfa Drugs Cross Reactors Itching  . Zithromax [Azithromycin]     History reviewed: allergies, current medications, past family history, past medical history, past social history, past surgical history and problem list  Chronic issues discussed: obesity  Acute issues discussed: At times gets ache in back of head, but by the time she gets up and gets shower, the headache is gone  Gets some allergy problems at times  Objective:     Biometrics BP 130/74   Pulse 72   Temp 98 F (36.7 C) (Oral)   Resp 16   Ht 5\' 5"  (1.651 m)   Wt 230 lb (104.3 kg)   SpO2 98%   BMI 38.27 kg/m    Wt Readings from Last 3 Encounters:  06/20/18 230 lb (104.3 kg)  05/08/17 225 lb 12.8 oz (102.4 kg)  12/20/16 227 lb 6.4 oz (103.1 kg)   BP Readings from Last 3 Encounters:  06/20/18 130/74  05/08/17 122/80  12/20/16 128/84     Cognitive Testing  Alert? Yes   Normal Appearance? yes   Oriented to person? Yes   Place? Yes  Time? Yes   Recall of three objects?  No    Can perform simple calculations? Yes   Displays appropriate judgment? Yes   Can read the correct time from a watch face?yes   General appearance: alert, no distress, WD/WN, AA female  Gen: wd, wn, nad HEENT: normocephalic, sclerae anicteric, TMs pearly, nares patent, no discharge or erythema, pharynx normal Oral cavity: MMM, no lesions Neck: supple, no lymphadenopathy, no thyromegaly, no masses Heart: RRR, normal S1, S2, no murmurs Lungs: CTA bilaterally, no  wheezes, rhonchi, or rales Abdomen: +bs, soft, non tender, non distended, no masses, no hepatomegaly, no splenomegaly Musculoskeletal: nontender, no swelling, no obvious deformity Extremities: no edema, no cyanosis, no clubbing Pulses: 2+  symmetric, upper and lower extremities, normal cap refill Neurological: alert, oriented x 3, CN2-12 intact, strength normal upper extremities and lower extremities, sensation normal throughout, DTRs 2+ throughout, no cerebellar signs, gait normal Psychiatric: normal affect, behavior normal, pleasant  Breast/pelvic - declines   Assessment:   Encounter Diagnoses  Name Primary?  . Medicare annual wellness visit, initial Yes  . Routine general medical examination at a health care facility   . Obesity (BMI 30-39.9)   . Screen for STD (sexually transmitted disease)   . Vaccine refused by patient   . Vaccine counseling   . Screen for colon cancer   . Screening for lipid disorders      Plan:   A preventative services visit was completed today.  During the course of the visit today, we discussed and counseled about appropriate screening and preventive services.  A health risk assessment was established today that included a review of current medications, allergies, social history, family history, medical and preventative health history, biometrics, and preventative screenings to identify potential safety concerns or impairments.  A personalized plan was printed today for your records and use.   Personalized health advice and education was given today to reduce health risks and promote self management and wellness.  Information regarding end of life planning was discussed today.  Conditions/risks identified: none  Chronic problems discussed today: obesity  Acute problems discussed today: Occipital tenderness - check BP 2 times per week at pharmacy, avoid unusual positions of head/neck when sleeping.   F/u if not improving.  Recommendations:  I  recommend a yearly ophthalmology/optometry visit for glaucoma screening and eye checkup  I recommended a yearly dental visit for hygiene and checkup  Advanced directives - discussed nature and purpose of Advanced Directives, encouraged them to complete them if they have not done so and/or encouraged them to get us a copy if they have done this already.  Referrals today: colonoscopy  Immunizations: I recommended a yearly influenza vaccine, typically in September when the vaccine is usually available She declines pneumococcal, flu and shingles vaccines She had shingles outbreak within the past 5 years   Cancer screening I reviewed last colonoscopy from 2008 which was normal but she is due now.  [place]  Mammogram normal from 06/11/2018  She is status post hysterectomy  Boyd Kerbsenny was seen today for cpe/mwv.  Diagnoses and all orders for this visit:  Medicare annual wellness visit, initial  Routine general medical examination at a health care facility -     POCT Urinalysis DIP (Proadvantage Device)  Obesity (BMI 30-39.9) -     Comprehensive metabolic panel -     CBC -     Lipid panel -     Hemoglobin A1c  Screen for STD (sexually transmitted disease) -     HIV Antibody (routine testing w rflx) -     RPR -     GC/Chlamydia Probe Amp  Vaccine refused by patient  Vaccine counseling  Screen for colon cancer -     Ambulatory referral to Gastroenterology  Screening for lipid disorders -     Lipid panel   Medicare Attestation A preventative services visit was completed today.  During the course of the visit the patient was educated and counseled about appropriate screening and preventive services.  A health risk assessment was established with the patient that included a review of current medications, allergies, social history, family history, medical and preventative health history, biometrics, and preventative screenings  to identify  potential safety concerns or impairments.  A personalized plan was printed today for the patient's records and use.   Personalized health advice and education was given today to reduce health risks and promote self management and wellness.  Information regarding end of life planning was discussed today.  Kristian Covey, PA-C   06/20/2018

## 2018-06-21 LAB — CBC
Hematocrit: 41.5 % (ref 34.0–46.6)
Hemoglobin: 13.4 g/dL (ref 11.1–15.9)
MCH: 27.2 pg (ref 26.6–33.0)
MCHC: 32.3 g/dL (ref 31.5–35.7)
MCV: 84 fL (ref 79–97)
Platelets: 384 10*3/uL (ref 150–450)
RBC: 4.93 x10E6/uL (ref 3.77–5.28)
RDW: 13.4 % (ref 11.7–15.4)
WBC: 6.8 10*3/uL (ref 3.4–10.8)

## 2018-06-21 LAB — COMPREHENSIVE METABOLIC PANEL
ALT: 11 IU/L (ref 0–32)
AST: 15 IU/L (ref 0–40)
Albumin/Globulin Ratio: 1.9 (ref 1.2–2.2)
Albumin: 4.3 g/dL (ref 3.8–4.8)
Alkaline Phosphatase: 99 IU/L (ref 39–117)
BUN/Creatinine Ratio: 16 (ref 12–28)
BUN: 14 mg/dL (ref 8–27)
Bilirubin Total: 0.5 mg/dL (ref 0.0–1.2)
CHLORIDE: 105 mmol/L (ref 96–106)
CO2: 24 mmol/L (ref 20–29)
Calcium: 9.9 mg/dL (ref 8.7–10.3)
Creatinine, Ser: 0.89 mg/dL (ref 0.57–1.00)
GFR calc Af Amer: 77 mL/min/{1.73_m2} (ref >59)
GFR calc non Af Amer: 67 mL/min/{1.73_m2} (ref >59)
Globulin, Total: 2.3 g/dL (ref 1.5–4.5)
Glucose: 82 mg/dL (ref 65–99)
Potassium: 4.1 mmol/L (ref 3.5–5.2)
Sodium: 143 mmol/L (ref 134–144)
Total Protein: 6.6 g/dL (ref 6.0–8.5)

## 2018-06-21 LAB — HEMOGLOBIN A1C
Est. average glucose Bld gHb Est-mCnc: 105 mg/dL
Hgb A1c MFr Bld: 5.3 % (ref 4.8–5.6)

## 2018-06-21 LAB — LIPID PANEL
Chol/HDL Ratio: 2.8 ratio (ref 0.0–4.4)
Cholesterol, Total: 233 mg/dL — ABNORMAL HIGH (ref 100–199)
HDL: 83 mg/dL (ref >39)
LDL Calculated: 135 mg/dL — ABNORMAL HIGH (ref 0–99)
TRIGLYCERIDES: 74 mg/dL (ref 0–149)
VLDL Cholesterol Cal: 15 mg/dL (ref 5–40)

## 2018-06-21 LAB — HIV ANTIBODY (ROUTINE TESTING W REFLEX): HIV Screen 4th Generation wRfx: NONREACTIVE

## 2018-06-21 LAB — RPR: RPR Ser Ql: NONREACTIVE

## 2018-06-24 ENCOUNTER — Other Ambulatory Visit: Payer: Self-pay | Admitting: Internal Medicine

## 2018-06-24 DIAGNOSIS — Z1211 Encounter for screening for malignant neoplasm of colon: Secondary | ICD-10-CM

## 2018-06-24 LAB — GC/CHLAMYDIA PROBE AMP
Chlamydia trachomatis, NAA: NEGATIVE
Neisseria gonorrhoeae by PCR: NEGATIVE

## 2018-07-07 ENCOUNTER — Encounter: Payer: PPO | Admitting: Medical

## 2018-07-29 ENCOUNTER — Telehealth: Payer: Self-pay | Admitting: Gastroenterology

## 2018-07-29 NOTE — Telephone Encounter (Signed)
Gastroenterology Pre-Procedure Review  Request Date: [facility]  11/03/2018    [facility] Requesting Physician: Dr. Allegra Lai  PATIENT REVIEW QUESTIONS: The patient responded to the following health history questions as indicated:    1. Are you having any GI issues? no 2. Do you have a personal history of Polyps? no 3. Do you have a family history of Colon Cancer or Polyps? no 4. Diabetes Mellitus? no 5. Joint replacements in the past 12 months?no 6. Major health problems in the past 3 months?no 7. Any artificial heart valves, MVP, or defibrillator?no    MEDICATIONS & ALLERGIES:    Patient reports the following regarding taking any anticoagulation/antiplatelet therapy:   Plavix, Coumadin, Eliquis, Xarelto, Lovenox, Pradaxa, Brilinta, or Effient? no Aspirin? yes (81 mg)  Patient confirms/reports the following medications:  Current Outpatient Medications  Medication Sig Dispense Refill  . aspirin 81 MG tablet Take 81 mg by mouth daily.     No current facility-administered medications for this visit.     Patient confirms/reports the following allergies:  Allergies  Allergen Reactions  . Sulfa Drugs Cross Reactors Itching  . Zithromax [Azithromycin]     No orders of the defined types were placed in this encounter.   AUTHORIZATION INFORMATION Primary Insurance: 1D#: Group #:  Secondary Insurance: 1D#: Group #:  SCHEDULE INFORMATION: Date:  Time: Location:

## 2018-07-30 ENCOUNTER — Telehealth: Payer: Self-pay

## 2018-07-30 ENCOUNTER — Other Ambulatory Visit: Payer: Self-pay

## 2018-07-30 DIAGNOSIS — Z1211 Encounter for screening for malignant neoplasm of colon: Secondary | ICD-10-CM

## 2018-07-30 NOTE — Telephone Encounter (Signed)
LVM for pt to call office to review and discuss her colonoscopy instructions.  Thanks [employer]

## 2018-09-11 ENCOUNTER — Telehealth: Payer: Self-pay | Admitting: Gastroenterology

## 2018-09-11 NOTE — Telephone Encounter (Signed)
Contacted patient to let her know I received her message in regards to canceling her colonoscopy.  She will call back to reschedule in December.  Left message on Endoscopy voicemail.  Thanks [employer]

## 2018-09-11 NOTE — Telephone Encounter (Signed)
Patient called & currently has a colonoscopy on 11-03-18 with Dr Allegra Lai. She would like to r/s to sometime in December .States she is not having any problems.

## 2018-11-03 ENCOUNTER — Ambulatory Visit: Admit: 2018-11-03 | Payer: BC Managed Care – PPO | Admitting: Gastroenterology

## 2018-11-03 SURGERY — COLONOSCOPY WITH PROPOFOL
Anesthesia: General

## 2019-03-03 ENCOUNTER — Other Ambulatory Visit: Payer: Self-pay

## 2019-03-03 DIAGNOSIS — Z20822 Contact with and (suspected) exposure to covid-19: Secondary | ICD-10-CM

## 2019-03-05 LAB — NOVEL CORONAVIRUS, NAA: SARS-CoV-2, NAA: NOT DETECTED

## 2019-05-11 ENCOUNTER — Ambulatory Visit: Payer: BC Managed Care – PPO | Attending: Internal Medicine

## 2019-05-11 DIAGNOSIS — Z20822 Contact with and (suspected) exposure to covid-19: Secondary | ICD-10-CM

## 2019-05-12 LAB — NOVEL CORONAVIRUS, NAA: SARS-CoV-2, NAA: NOT DETECTED

## 2019-07-13 ENCOUNTER — Other Ambulatory Visit: Payer: Self-pay

## 2019-07-13 DIAGNOSIS — Z20822 Contact with and (suspected) exposure to covid-19: Secondary | ICD-10-CM

## 2019-07-14 LAB — NOVEL CORONAVIRUS, NAA: SARS-CoV-2, NAA: NOT DETECTED

## 2019-07-14 LAB — SARS-COV-2, NAA 2 DAY TAT

## 2019-07-20 ENCOUNTER — Other Ambulatory Visit: Payer: Self-pay | Admitting: Medical

## 2019-07-20 DIAGNOSIS — Z1231 Encounter for screening mammogram for malignant neoplasm of breast: Secondary | ICD-10-CM

## 2019-07-28 ENCOUNTER — Ambulatory Visit
Admission: RE | Admit: 2019-07-28 | Discharge: 2019-07-28 | Disposition: A | Discharge status: Home / Self Care | Payer: BC Managed Care – PPO | Source: Ambulatory Visit | Attending: Medical | Admitting: Medical

## 2019-07-28 ENCOUNTER — Other Ambulatory Visit: Payer: Self-pay

## 2019-07-28 DIAGNOSIS — Z1231 Encounter for screening mammogram for malignant neoplasm of breast: Secondary | ICD-10-CM

## 2019-07-29 ENCOUNTER — Ambulatory Visit: Payer: BC Managed Care – PPO

## 2019-09-18 ENCOUNTER — Ambulatory Visit: Payer: BC Managed Care – PPO | Admitting: Medical

## 2019-11-20 ENCOUNTER — Ambulatory Visit: Payer: BC Managed Care – PPO | Admitting: Medical

## 2019-12-09 ENCOUNTER — Other Ambulatory Visit: Payer: BC Managed Care – PPO

## 2019-12-09 ENCOUNTER — Other Ambulatory Visit: Payer: Self-pay

## 2019-12-09 DIAGNOSIS — Z20822 Contact with and (suspected) exposure to covid-19: Secondary | ICD-10-CM

## 2019-12-10 LAB — NOVEL CORONAVIRUS, NAA

## 2019-12-16 ENCOUNTER — Other Ambulatory Visit: Payer: BC Managed Care – PPO

## 2019-12-16 ENCOUNTER — Other Ambulatory Visit: Payer: Self-pay | Admitting: Radiology

## 2019-12-16 DIAGNOSIS — Z20822 Contact with and (suspected) exposure to covid-19: Secondary | ICD-10-CM | POA: Diagnosis not present

## 2019-12-18 LAB — SARS-COV-2, NAA 2 DAY TAT

## 2019-12-18 LAB — NOVEL CORONAVIRUS, NAA: SARS-CoV-2, NAA: NOT DETECTED

## 2020-03-03 ENCOUNTER — Telehealth: Payer: Self-pay

## 2020-03-03 NOTE — Telephone Encounter (Signed)
LVM for pt to advise of the need for AWV appt. KH

## 2020-04-04 ENCOUNTER — Ambulatory Visit
Admit: 2020-04-04 | Discharge: 2020-04-04 | Disposition: A | Discharge status: Home / Self Care | Payer: BC Managed Care – PPO | Source: Ambulatory Visit | Attending: Family Medicine | Admitting: Family Medicine

## 2020-04-04 ENCOUNTER — Other Ambulatory Visit: Payer: Self-pay

## 2020-04-04 VITALS — BP 164/94 | HR 79 | Temp 98.4°F | Resp 18 | Ht 66.0 in | Wt 250.0 lb

## 2020-04-04 DIAGNOSIS — N390 Urinary tract infection, site not specified: Secondary | ICD-10-CM

## 2020-04-04 DIAGNOSIS — R350 Frequency of micturition: Secondary | ICD-10-CM | POA: Diagnosis not present

## 2020-04-04 LAB — URINALYSIS, COMPLETE (UACMP) WITH MICROSCOPIC
Bilirubin Urine: NEGATIVE
Glucose, UA: NEGATIVE mg/dL
Leukocytes,Ua: NEGATIVE
Nitrite: NEGATIVE
Protein, ur: NEGATIVE mg/dL
Specific Gravity, Urine: >1.03 — ABNORMAL HIGH (ref 1.005–1.030)
pH: 5.5 (ref 5.0–8.0)

## 2020-04-04 MED ORDER — PHENAZOPYRIDINE HCL 200 MG PO TABS: 200.0000 mg | ORAL_TABLET | Freq: Three times a day (TID) | ORAL | 0 refills | Status: DC

## 2020-04-04 MED ORDER — CEPHALEXIN 500 MG PO CAPS: 500.0000 mg | ORAL_CAPSULE | Freq: Two times a day (BID) | ORAL | 0 refills | Status: AC

## 2020-04-04 NOTE — ED Provider Notes (Signed)
[facility]    CSN: [id number] Arrival date & time: 04/04/20  1247      History   Chief Complaint Chief Complaint  Patient presents with  . Appointment  . Urinary Frequency    HPI Sherry Lowe is a 69 y.o. female.   HPI   69 year old female here for evaluation of bladder pressure and increased urinary frequency.  Patient ports that she has had symptoms for the past 3 days.  She is also had some pain with urination.  Patient reports that her urine has been clear and there is no cloudiness or blood.  No back pain, no fever, no vaginal itching or discharge, or odor.  Past Medical History:  Diagnosis Date  . H/O bilateral oophorectomy 1985   along with hysterectomy  . H/O cardiac catheterization 2005   reportedly normal per patient  . History of bone density study    2012, 2017 normal  . History of UTI   . Obesity   . S/P hysterectomy 1995  . Seasonal allergies   . Shingles 2014  . Wears glasses     Patient Active Problem List   Diagnosis Date Noted  . Screen for colon cancer 12/25/2016  . Vaccine refused by patient 12/20/2016  . Vaccine counseling 08/18/2015  . Screening for condition 08/18/2015  . Screen for STD (sexually transmitted disease) 08/18/2015  . Encounter for health maintenance examination in adult 08/18/2015  . Routine general medical examination at a health care facility 10/29/2011  . Obesity (BMI 30-39.9) 04/20/2011    Past Surgical History:  Procedure Laterality Date  . CARDIAC CATHETERIZATION  1995   reportedly normal per patient.    . COLONOSCOPY  2013   normal per patient  . VAGINAL HYSTERECTOMY  1985   total, due to fibroids  . WISDOM TOOTH EXTRACTION      OB History   No obstetric history on file.      Home Medications    Prior to Admission medications   Medication Sig Start Date End Date Taking? Authorizing Provider  aspirin 81 MG tablet Take 81 mg by mouth daily.   Yes [provider]  cephALEXin  (KEFLEX) 500 MG capsule Take 1 capsule (500 mg total) by mouth 2 (two) times daily for 5 days. 04/04/20 04/09/20  Becky Augusta, NP  phenazopyridine (PYRIDIUM) 200 MG tablet Take 1 tablet (200 mg total) by mouth 3 (three) times daily. 04/04/20   Becky Augusta, NP    Family History Family History  Problem Relation Age of Onset  . Hyperlipidemia Father        Living, 33  . Cancer Father        prostate  . Hypertension Father   . Hyperlipidemia Mother        Living 28  . Healthy Brother        x2  . Cancer Maternal Grandmother        stomach  . Stroke Maternal Grandfather     Social History Social History   Tobacco Use  . Smoking status: Never Smoker  . Smokeless tobacco: Never Used  Vaping Use  . Vaping Use: Never used  Substance Use Topics  . Alcohol use: No  . Drug use: No     Allergies   Sulfa drugs cross reactors and Zithromax [azithromycin]   Review of Systems Review of Systems  Constitutional: Negative for activity change, appetite change and fever.  Gastrointestinal: Positive for abdominal pain. Negative for diarrhea, nausea and vomiting.  Genitourinary: Positive for dysuria and frequency. Negative for hematuria, urgency, vaginal bleeding, vaginal discharge and vaginal pain.  Musculoskeletal: Negative for back pain.  Skin: Negative for rash.  Hematological: Negative.   Psychiatric/Behavioral: Negative.      Physical Exam Triage Vital Signs ED Triage Vitals  Enc Vitals Group     BP 04/04/20 1326 (!) 164/94     Pulse Rate 04/04/20 1326 79     Resp 04/04/20 1326 18     Temp 04/04/20 1326 98.4 F (36.9 C)     Temp Source 04/04/20 1326 Oral     SpO2 04/04/20 1326 100 %     Weight 04/04/20 1323 250 lb (113.4 kg)     Height 04/04/20 1323 5\' 6"  (1.676 m)     Head Circumference --      Peak Flow --      Pain Score 04/04/20 1319 4     Pain Loc --      Pain Edu? --      Excl. in GC? --    No data found.  Updated Vital Signs BP (!) 164/94 (BP Location:  Left Arm)   Pulse 79   Temp 98.4 F (36.9 C) (Oral)   Resp 18   Ht 5\' 6"  (1.676 m)   Wt 250 lb (113.4 kg)   SpO2 100%   BMI 40.35 kg/m   Visual Acuity Right Eye Distance:   Left Eye Distance:   Bilateral Distance:    Right Eye Near:   Left Eye Near:    Bilateral Near:     Physical Exam Vitals and nursing note reviewed.  Constitutional:      General: She is not in acute distress.    Appearance: Normal appearance. She is normal weight.  HENT:     Head: Normocephalic and atraumatic.  Eyes:     General: No scleral icterus.    Extraocular Movements: Extraocular movements intact.     Conjunctiva/sclera: Conjunctivae normal.     Pupils: Pupils are equal, round, and reactive to light.  Cardiovascular:     Rate and Rhythm: Normal rate and regular rhythm.     Pulses: Normal pulses.     Heart sounds: Normal heart sounds.  Pulmonary:     Effort: Pulmonary effort is normal.     Breath sounds: Normal breath sounds. No wheezing, rhonchi or rales.  Abdominal:     General: Bowel sounds are normal.     Palpations: Abdomen is soft.     Tenderness: There is abdominal tenderness. There is no right CVA tenderness or left CVA tenderness.     Comments: Suprapubic tenderness  Musculoskeletal:        General: No swelling or tenderness. Normal range of motion.  Skin:    General: Skin is warm and dry.     Capillary Refill: Capillary refill takes less than 2 seconds.     Findings: No erythema.  Neurological:     General: No focal deficit present.     Mental Status: She is alert and oriented to person, place, and time.  Psychiatric:        Mood and Affect: Mood normal.        Behavior: Behavior normal.        Thought Content: Thought content normal.        Judgment: Judgment normal.      UC Treatments / Results  Labs (all labs ordered are listed, but only abnormal results are displayed) Labs Reviewed  URINALYSIS, COMPLETE (UACMP)  WITH MICROSCOPIC - Abnormal; Notable for the  following components:      Result Value   APPearance HAZY (*)    Specific Gravity, Urine >1.030 (*)    Hgb urine dipstick TRACE (*)    Ketones, ur TRACE (*)    Bacteria, UA FEW (*)    All other components within normal limits  URINE CULTURE    EKG   Radiology No results found.  Procedures Procedures (including critical care time)  Medications Ordered in UC Medications - No data to display  Initial Impression / Assessment and Plan / UC Course  I have reviewed the triage vital signs and the nursing notes.  Pertinent labs & imaging results that were available during my care of the patient were reviewed by me and considered in my medical decision making (see chart for details).   Patient is here for evaluation of increased bladder pressure and urinary frequency x3 days.  Patient's physical exam reveals some suprapubic tenderness, no CVA tenderness, and is otherwise benign.  UA reveals a specific gravity greater than 1.030, trace ketones, trace hemoglobin, few bacteria, 21-50 WBCs.  Will send urine for culture.  Will treat with Keflex 500 mg twice daily x5 days and Pyridium.   Final Clinical Impressions(s) / UC Diagnoses   Final diagnoses:  Lower urinary tract infectious disease     Discharge Instructions     Flex twice daily with food for 5 days.  Use the Pyridium every 8 hours as needed for urinary discomfort.  This will turn your pee bright orange.  Increase your water intake to at least 64 ounces a day.  We will culture the urine and if need be change your antibiotics based on the results.  If you have any new or worsening symptoms either return for reevaluation or follow-up with your primary care provider.    ED Prescriptions    Medication Sig Dispense Auth. Provider   cephALEXin (KEFLEX) 500 MG capsule Take 1 capsule (500 mg total) by mouth 2 (two) times daily for 5 days. 10 capsule Becky Augusta, NP   phenazopyridine (PYRIDIUM) 200 MG tablet Take 1 tablet  (200 mg total) by mouth 3 (three) times daily. 6 tablet Becky Augusta, NP     PDMP not reviewed this encounter.   Becky Augusta, NP 04/04/20 [phone]

## 2020-04-04 NOTE — Discharge Instructions (Addendum)
Flex twice daily with food for 5 days.  Use the Pyridium every 8 hours as needed for urinary discomfort.  This will turn your pee bright orange.  Increase your water intake to at least 64 ounces a day.  We will culture the urine and if need be change your antibiotics based on the results.  If you have any new or worsening symptoms either return for reevaluation or follow-up with your primary care provider.

## 2020-04-04 NOTE — ED Triage Notes (Signed)
Patient states that she has been having bladder pressure since Friday evening. States that she has been going to urinate more frequently.

## 2020-04-06 LAB — URINE CULTURE: Culture: 40000 — AB

## 2020-09-20 DIAGNOSIS — H2513 Age-related nuclear cataract, bilateral: Secondary | ICD-10-CM | POA: Diagnosis not present

## 2020-10-07 ENCOUNTER — Other Ambulatory Visit: Payer: Self-pay | Admitting: Family Medicine

## 2020-10-07 DIAGNOSIS — Z1231 Encounter for screening mammogram for malignant neoplasm of breast: Secondary | ICD-10-CM

## 2020-11-30 ENCOUNTER — Other Ambulatory Visit: Payer: Self-pay

## 2020-11-30 ENCOUNTER — Ambulatory Visit
Admission: RE | Admit: 2020-11-30 | Discharge: 2020-11-30 | Disposition: A | Discharge status: Home / Self Care | Payer: BC Managed Care – PPO | Source: Ambulatory Visit | Attending: Ophthalmology | Admitting: Ophthalmology

## 2020-11-30 DIAGNOSIS — Z1231 Encounter for screening mammogram for malignant neoplasm of breast: Secondary | ICD-10-CM

## 2020-12-07 ENCOUNTER — Ambulatory Visit: Payer: Self-pay

## 2021-04-13 ENCOUNTER — Encounter: Payer: BC Managed Care – PPO | Admitting: Medical

## 2021-05-24 ENCOUNTER — Other Ambulatory Visit: Payer: Self-pay

## 2021-05-24 ENCOUNTER — Ambulatory Visit (INDEPENDENT_AMBULATORY_CARE_PROVIDER_SITE_OTHER): Payer: BC Managed Care – PPO | Admitting: Internal Medicine

## 2021-05-24 ENCOUNTER — Encounter: Payer: Self-pay | Admitting: Internal Medicine

## 2021-05-24 VITALS — BP 128/76 | HR 88 | Temp 98.4°F | Resp 16 | Ht 65.0 in | Wt 240.0 lb

## 2021-05-24 DIAGNOSIS — Z1211 Encounter for screening for malignant neoplasm of colon: Secondary | ICD-10-CM | POA: Diagnosis not present

## 2021-05-24 DIAGNOSIS — Z13220 Encounter for screening for lipoid disorders: Secondary | ICD-10-CM | POA: Diagnosis not present

## 2021-05-24 DIAGNOSIS — M7121 Synovial cyst of popliteal space [Baker], right knee: Secondary | ICD-10-CM

## 2021-05-24 LAB — CBC WITH DIFFERENTIAL/PLATELET
Absolute Monocytes: 572 cells/uL (ref 200–950)
Basophils Absolute: 39 cells/uL (ref 0–200)
Basophils Relative: 0.7 %
Eosinophils Absolute: 99 cells/uL (ref 15–500)
Eosinophils Relative: 1.8 %
HCT: 43.2 % (ref 35.0–45.0)
Hemoglobin: 13.9 g/dL (ref 11.7–15.5)
Lymphs Abs: 1683 cells/uL (ref 850–3900)
MCH: 27.8 pg (ref 27.0–33.0)
MCHC: 32.2 g/dL (ref 32.0–36.0)
MCV: 86.4 fL (ref 80.0–100.0)
MPV: 10.1 fL (ref 7.5–12.5)
Monocytes Relative: 10.4 %
Neutro Abs: 3108 cells/uL (ref 1500–7800)
Neutrophils Relative %: 56.5 %
Platelets: 360 10*3/uL (ref 140–400)
RBC: 5 10*6/uL (ref 3.80–5.10)
RDW: 13.2 % (ref 11.0–15.0)
Total Lymphocyte: 30.6 %
WBC: 5.5 10*3/uL (ref 3.8–10.8)

## 2021-05-24 LAB — COMPLETE METABOLIC PANEL WITH GFR
AG Ratio: 1.7 (calc) (ref 1.0–2.5)
ALT: 12 U/L (ref 6–29)
AST: 15 U/L (ref 10–35)
Albumin: 4.2 g/dL (ref 3.6–5.1)
Alkaline phosphatase (APISO): 93 U/L (ref 37–153)
BUN: 16 mg/dL (ref 7–25)
CO2: 26 mmol/L (ref 20–32)
Calcium: 9.5 mg/dL (ref 8.6–10.4)
Chloride: 107 mmol/L (ref 98–110)
Creat: 0.95 mg/dL (ref 0.60–1.00)
Globulin: 2.5 g/dL (calc) (ref 1.9–3.7)
Glucose, Bld: 89 mg/dL (ref 65–99)
Potassium: 4.9 mmol/L (ref 3.5–5.3)
Sodium: 140 mmol/L (ref 135–146)
Total Bilirubin: 0.8 mg/dL (ref 0.2–1.2)
Total Protein: 6.7 g/dL (ref 6.1–8.1)
eGFR: 64 mL/min/{1.73_m2} (ref >=60)

## 2021-05-24 LAB — LIPID PANEL
Cholesterol: 229 mg/dL — ABNORMAL HIGH (ref <200)
HDL: 81 mg/dL (ref >=50)
LDL Cholesterol (Calc): 125 mg/dL (calc) — ABNORMAL HIGH
Non-HDL Cholesterol (Calc): 148 mg/dL (calc) — ABNORMAL HIGH (ref <130)
Total CHOL/HDL Ratio: 2.8 (calc) (ref <5.0)
Triglycerides: 122 mg/dL (ref <150)

## 2021-05-24 NOTE — Patient Instructions (Addendum)
It was great seeing you today!  Plan discussed at today's visit: -Blood work ordered today, results will be uploaded to MyChart.  -Cologuard ordered today  Follow up in: 6 months   Take care and let us know if you have any questions or concerns prior to your next visit.  Dr. Thana Ates Cyst A Baker cyst, also called a popliteal cyst, is a growth that forms at the back of the knee. The cyst forms when the fluid-filled sac (bursa) that cushions the knee joint becomes enlarged. What are the causes? In most cases, a Baker cyst results from another knee problem that causes swelling inside the knee. This makes the fluid inside the knee joint (synovial fluid) flow into the bursa behind the knee, causing the bursa to enlarge. What increases the risk? You may be more likely to develop a Baker cyst if you already have a knee problem, such as: A tear in cartilage that cushions the knee joint (meniscal tear). A tear in the tissues that connect the bones of the knee joint (ligament tear). Knee swelling from osteoarthritis, rheumatoid arthritis, or gout. What are the signs or symptoms? The main symptom of this condition is a lump behind the knee. This may be the only symptom of the condition. The lump may be painful, especially when the knee is straightened. If the lump is painful, the pain may come and go. The knee may also be stiff. Symptoms may quickly get more severe if the cyst breaks open (ruptures). If the cyst ruptures, you may feel the following in your knee and calf: Sudden or worsening pain. Swelling. Bruising. Redness in the calf. A Baker cyst does not always cause symptoms. How is this diagnosed? This condition may be diagnosed based on your symptoms and medical history. Your health care provider will also do a physical exam. This may include: Feeling the cyst to check whether it is tender. Checking your knee for signs of another knee condition that causes swelling. You may have  imaging tests, such as: X-rays. MRI. Ultrasound. How is this treated? A Baker cyst that is not painful may go away without treatment. If the cyst gets large or painful, it will likely get better if the underlying knee problem is treated. If needed, treatment for a Baker cyst may include: Resting. Keeping weight off of the knee. This means not leaning on the knee to support your body weight. Taking NSAIDs, such as ibuprofen, to reduce pain and swelling. Having a procedure to drain the fluid from the cyst with a needle (aspiration). You may also get an injection of a medicine that reduces swelling (steroid). Having surgery. This may be needed if other treatments do not work. This usually involves correcting knee damage and removing the cyst. Follow these instructions at home: Activity Rest as told by your health care provider. Avoid activities that make pain or swelling worse. Return to your normal activities as told by your health care provider. Ask your health care provider what activities are safe for you. Do not use the injured limb to support your body weight until your health care provider says that you can. Use crutches as told by your health care provider. General instructions Take over-the-counter and prescription medicines only as told by your health care provider. Keep all follow-up visits as told by your health care provider. This is important. Contact a health care provider if: You have knee pain, stiffness, or swelling that does not get better. Get help right away  if: You have sudden or worsening pain and swelling in your calf area. Summary A Baker cyst, also called a popliteal cyst, is a growth that forms at the back of the knee. In most cases, a Baker cyst results from another knee problem that causes swelling inside the knee. A Baker cyst that is not painful may go away without treatment. If needed, treatment for a Baker cyst may include resting, keeping weight off of the  knee, medicines, or draining fluid from the cyst. Surgery may be needed if other treatments are not effective. This information is not intended to replace advice given to you by your health care provider. Make sure you discuss any questions you have with your health care provider. Document Revised: 08/22/2018 Document Reviewed: 08/22/2018 Elsevier Patient Education  2022 Elsevier Inc.  Pneumococcal Conjugate Vaccine (Prevnar 20) Suspension for Injection What is this medication? PNEUMOCOCCAL VACCINE (NEU mo KOK al vak SEEN) is a vaccine. It prevents pneumococcus bacterial infections. These bacteria can cause serious infections like pneumonia, meningitis, and blood infections. This vaccine will not treat an infection and will not cause infection. This vaccine is recommended for adults 18 years and older. This medicine may be used for other purposes; ask your health care provider or pharmacist if you have questions. COMMON BRAND NAME(S): Prevnar 20 What should I tell my care team before I take this medication? They need to know if you have any of these conditions: bleeding disorder fever immune system problems an unusual or allergic reaction to pneumococcal vaccine, diphtheria toxoid, other vaccines, other medicines, foods, dyes, or preservatives pregnant or trying to get pregnant breast-feeding How should I use this medication? This vaccine is injected into a muscle. It is given by a health care provider. A copy of Vaccine Information Statements will be given before each vaccination. Be sure to read this information carefully each time. This sheet may change often. Talk to your health care provider about the use of this medicine in children. Special care may be needed. Overdosage: If you think you have taken too much of this medicine contact a poison control center or emergency room at once. NOTE: This medicine is only for you. Do not share this medicine with others. What if I miss a  dose? This does not apply. This medicine is not for regular use. What may interact with this medication? medicines for cancer chemotherapy medicines that suppress your immune function steroid medicines like prednisone or cortisone This list may not describe all possible interactions. Give your health care provider a list of all the medicines, herbs, non-prescription drugs, or dietary supplements you use. Also tell them if you smoke, drink alcohol, or use illegal drugs. Some items may interact with your medicine. What should I watch for while using this medication? Mild fever and pain should go away in 3 days or less. Report any unusual symptoms to your health care provider. What side effects may I notice from receiving this medication? Side effects that you should report to your doctor or health care professional as soon as possible: allergic reactions (skin rash, itching or hives; swelling of the face, lips, or tongue) confusion fast, irregular heartbeat fever over 102 degrees F muscle weakness seizures trouble breathing unusual bruising or bleeding Side effects that usually do not require medical attention (report to your doctor or health care professional if they continue or are bothersome): fever of 102 degrees F or less headache joint pain muscle cramps, pain pain, tender at site where injected This list  may not describe all possible side effects. Call your doctor for medical advice about side effects. You may report side effects to FDA at 1-800-FDA-1088. Where should I keep my medication? This vaccine is only given by a health care provider. It will not be stored at home. NOTE: This sheet is a summary. It may not cover all possible information. If you have questions about this medicine, talk to your doctor, pharmacist, or health care provider.  2022 Elsevier/Gold Standard (2019-12-24 00:00:00)

## 2021-05-24 NOTE — Progress Notes (Signed)
New Patient Office Visit  Subjective:  Patient ID: Sherry Lowe, female    DOB: [birth year]  Age: 71 y.o. MRN: [id number]  CC:  Chief Complaint  Patient presents with   Establish Care   Leg Swelling    Right leg new found cyst    HPI Sherry Lowe presents as a new patient. She has no chronic medical conditions other than obesity and doesn't take any medication daily. She is complaining of a cyst on the back of her right knee.  CYST: -Located on right leg behind knee -Mild pain when sitting for long periods and when she stands up  -Stretching out and Ibuprofen helped -Resolved on its own after a few days but came back -Does cause her to limp but denies knee pain or swelling, knee instability or locking -Duration: 10 days  -Itching: no -Burning: no -Redness: no -Painful:  sometimes -Fevers: no  Health Maintenance: -Blood work due -Colon cancer screening due  -Mammogram 8/22: Birads 1 -Politely declines flu shot, will consider pneumonia vaccine  Past Medical History:  Diagnosis Date   H/O bilateral oophorectomy 1985   along with hysterectomy   H/O cardiac catheterization 2005   reportedly normal per patient   History of bone density study    2012, 2017 normal   History of UTI    Obesity    S/P hysterectomy 1995   Seasonal allergies    Shingles 2014   Wears glasses     Past Surgical History:  Procedure Laterality Date   [place]   reportedly normal per patient.     COLONOSCOPY  2013   normal per patient   VAGINAL HYSTERECTOMY  1985   total, due to fibroids   WISDOM TOOTH EXTRACTION      Family History  Problem Relation Age of Onset   Hyperlipidemia Father        Living, 81   Cancer Father        prostate   Hypertension Father    Hyperlipidemia Mother        Living 52   Healthy Brother        x2   Cancer Maternal Grandmother        stomach   Stroke Maternal Grandfather     Social History   Socioeconomic History    Marital status: Divorced    Spouse name: Not on file   Number of children: 1   Years of education: Not on file   Highest education level: Not on file  Occupational History   Not on file  Tobacco Use   Smoking status: Never   Smokeless tobacco: Never  Vaping Use   Vaping Use: Never used  Substance and Sexual Activity   Alcohol use: No   Drug use: No   Sexual activity: Yes    Partners: Male  Other Topics Concern   Not on file  Social History Narrative   She lives alone.  She has one grown son.   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   Was prior working as a Programmer, multimedia as a Programmer, applications.  Doing some work at [employer] level of education:  B.S.   [employer] a few times per week   As of 05/2018   Social Determinants of Health   Financial Resource Strain: Not on file  Food Insecurity: Not on file  Transportation Needs: Not on file  Physical Activity:  Not on file  Stress: Not on file  Social Connections: Not on file  Intimate Partner Violence: Not on file    ROS Review of Systems  Constitutional:  Negative for chills and fever.  Eyes:  Negative for visual disturbance.  Respiratory:  Negative for cough and shortness of breath.   Cardiovascular:  Positive for leg swelling. Negative for chest pain.  Gastrointestinal:  Negative for abdominal pain, nausea and vomiting.  Musculoskeletal:  Positive for gait problem.  Neurological:  Negative for dizziness and headaches.   Objective:   Today's Vitals: BP 128/76    Pulse 88    Temp 98.4 F (36.9 C) (Oral)    Resp 16    Ht 5\' 5"  (1.651 m)    Wt 240 lb (108.9 kg)    SpO2 97%    BMI 39.94 kg/m   Physical Exam Constitutional:      Appearance: Normal appearance.  HENT:     Head: Normocephalic and atraumatic.     Mouth/Throat:     Mouth: Mucous membranes are moist.     Pharynx: Oropharynx is clear.  Eyes:     Conjunctiva/sclera: Conjunctivae normal.   Cardiovascular:     Rate and Rhythm: Normal rate and regular rhythm.  Pulmonary:     Effort: Pulmonary effort is normal.     Breath sounds: Normal breath sounds.  Musculoskeletal:     Right lower leg: No edema.     Left lower leg: No edema.     Comments: Baker's cyst present on posterior aspect of right knee  Skin:    General: Skin is warm and dry.  Neurological:     General: No focal deficit present.     Mental Status: She is alert. Mental status is at baseline.  Psychiatric:        Mood and Affect: Mood normal.        Behavior: Behavior normal.    Assessment & Plan:   1. Synovial cyst of right popliteal space: Consistent with Baker's cyst, discussed trying to stay off of feet, NSAIDs as needed, compression wrap or referral to Ortho for steroid injection vs. Surgical options. Will try more conservative options and will follow up if pain doesn't resolve. She is due for routine labs today as well. Follow up in 6 months.  - CBC w/Diff/Platelet - COMPLETE METABOLIC PANEL WITH GFR  2. Lipid screening: Cholesterol screening today with above labs.   - Lipid Profile  3. Colon cancer screening: Cologuard ordered today.  - Cologuard   Follow-up: Return in about 6 months (around 11/21/2021).   Sherry Medici, DO

## 2021-05-30 ENCOUNTER — Telehealth: Payer: Self-pay

## 2021-05-30 ENCOUNTER — Encounter: Payer: Self-pay | Admitting: Internal Medicine

## 2021-05-30 NOTE — Telephone Encounter (Signed)
Copied from CRM [id number]. Topic: Quick Conservator, museum/gallery Patient (Clinic Use ONLY) >> May 30, 2021  9:26 AM Mock, Orlinda Blalock R wrote: Reason for CRM: LVM for patient to call to let us know which of her insurance is primary the [employer] or [employer].

## 2021-05-30 NOTE — Telephone Encounter (Signed)
Copied from CRM [id number]. Topic: Quick Conservator, museum/gallery Patient (Clinic Use ONLY) >> May 30, 2021  9:26 AM Mock, Orlinda Blalock R wrote: Reason for CRM: LVM for patient to call to let us know which of her insurance is primary the [facility] or [employer]. >> May 30, 2021  9:32 AM Elliot Gault wrote: Patient returned call and [employer] is primary

## 2021-06-12 LAB — COLOGUARD: COLOGUARD: NEGATIVE

## 2021-06-22 ENCOUNTER — Other Ambulatory Visit: Payer: Self-pay

## 2021-06-22 ENCOUNTER — Encounter: Payer: Self-pay | Admitting: Nurse Practitioner

## 2021-06-22 ENCOUNTER — Ambulatory Visit (INDEPENDENT_AMBULATORY_CARE_PROVIDER_SITE_OTHER): Payer: BC Managed Care – PPO | Admitting: Nurse Practitioner

## 2021-06-22 VITALS — BP 120/78 | HR 96 | Temp 98.1°F | Resp 16 | Ht 65.0 in | Wt 242.3 lb

## 2021-06-22 DIAGNOSIS — M7121 Synovial cyst of popliteal space [Baker], right knee: Secondary | ICD-10-CM | POA: Diagnosis not present

## 2021-06-22 NOTE — Progress Notes (Signed)
? ?BP 120/78   Pulse 96   Temp 98.1 ?F (36.7 ?C) (Oral)   Resp 16   Ht 5' 5" (1.651 m)   Wt 242 lb 4.8 oz (109.9 kg)   SpO2 98%   BMI 40.32 kg/m?   ? ?Subjective:  ? ? Patient ID: Sherry Lowe, female    DOB: [birth year], 71 y.o.   MRN: [id number] ? ?HPI: ?Sherry Lowe is a 71 y.o. female ? ?Chief Complaint  ?Patient presents with  ? Leg Pain  ?  Right leg  ? ?Cyst: she has a synovial cyst of the right popliteal space. She was seen by Dr. Rosana Berger on 05/24/2021. She was instructed to stay off of her feet, NSAIDs as needed and compression wrap.  She says the pain went away for a few days but then came back.  She says it is not too bad today.  She says but when the pain is bad it is hard for her to walk. Swelling noted to back of knee. She denies any injury. She says she is ready to see an orthopedic.  Will place referral.   ? ?Relevant past medical, surgical, family and social history reviewed and updated as indicated. Interim medical history since our last visit reviewed. ?Allergies and medications reviewed and updated. ? ?Review of Systems ? ?Constitutional: Negative for fever or weight change.  ?Respiratory: Negative for cough and shortness of breath.   ?Cardiovascular: Negative for chest pain or palpitations.  ?Gastrointestinal: Negative for abdominal pain, no bowel changes.  ?Musculoskeletal: Positive for gait problem or joint swelling. Positive for swelling behind right knee ?Skin: Negative for rash.  ?Neurological: Negative for dizziness or headache.  ?No other specific complaints in a complete review of systems (except as listed in HPI above).  ? ?   ?Objective:  ?  ?BP 120/78   Pulse 96   Temp 98.1 ?F (36.7 ?C) (Oral)   Resp 16   Ht 5' 5" (1.651 m)   Wt 242 lb 4.8 oz (109.9 kg)   SpO2 98%   BMI 40.32 kg/m?   ?Wt Readings from Last 3 Encounters:  ?06/22/21 242 lb 4.8 oz (109.9 kg)  ?05/24/21 240 lb (108.9 kg)  ?04/04/20 250 lb (113.4 kg)  ?  ?Physical Exam ? ?Constitutional: Patient appears  well-developed and well-nourished. Obese  No distress.  ?HEENT: head atraumatic, normocephalic, pupils equal and reactive to light,  neck supple ?Cardiovascular: Normal rate, regular rhythm and normal heart sounds.  No murmur heard. No BLE edema. ?Pulmonary/Chest: Effort normal and breath sounds normal. No respiratory distress. ?Abdominal: Soft.  There is no tenderness. ?MSK: swelling noted behind right knee, baker's cyst ?Psychiatric: Patient has a normal mood and affect. behavior is normal. Judgment and thought content normal.  ? ?Results for orders placed or performed in visit on 05/24/21  ?Cologuard  ?Result Value Ref Range  ? COLOGUARD Negative Negative  ?CBC w/Diff/Platelet  ?Result Value Ref Range  ? WBC 5.5 3.8 - 10.8 Thousand/uL  ? RBC 5.00 3.80 - 5.10 Million/uL  ? Hemoglobin 13.9 11.7 - 15.5 g/dL  ? HCT 43.2 35.0 - 45.0 %  ? MCV 86.4 80.0 - 100.0 fL  ? MCH 27.8 27.0 - 33.0 pg  ? MCHC 32.2 32.0 - 36.0 g/dL  ? RDW 13.2 11.0 - 15.0 %  ? Platelets 360 140 - 400 Thousand/uL  ? MPV 10.1 7.5 - 12.5 fL  ? Neutro Abs 3,108 1,500 - 7,800 cells/uL  ? Lymphs Abs 1,683 850 -  3,900 cells/uL  ? Absolute Monocytes 572 200 - 950 cells/uL  ? Eosinophils Absolute 99 15 - 500 cells/uL  ? Basophils Absolute 39 0 - 200 cells/uL  ? Neutrophils Relative % 56.5 %  ? Total Lymphocyte 30.6 %  ? Monocytes Relative 10.4 %  ? Eosinophils Relative 1.8 %  ? Basophils Relative 0.7 %  ?COMPLETE METABOLIC PANEL WITH GFR  ?Result Value Ref Range  ? Glucose, Bld 89 65 - 99 mg/dL  ? BUN 16 7 - 25 mg/dL  ? Creat 0.95 0.60 - 1.00 mg/dL  ? eGFR 64 > OR = 60 mL/min/1.7m  ? BUN/Creatinine Ratio NOT APPLICABLE 6 - 22 (calc)  ? Sodium 140 135 - 146 mmol/L  ? Potassium 4.9 3.5 - 5.3 mmol/L  ? Chloride 107 98 - 110 mmol/L  ? CO2 26 20 - 32 mmol/L  ? Calcium 9.5 8.6 - 10.4 mg/dL  ? Total Protein 6.7 6.1 - 8.1 g/dL  ? Albumin 4.2 3.6 - 5.1 g/dL  ? Globulin 2.5 1.9 - 3.7 g/dL (calc)  ? AG Ratio 1.7 1.0 - 2.5 (calc)  ? Total Bilirubin 0.8 0.2 - 1.2  mg/dL  ? Alkaline phosphatase (APISO) 93 37 - 153 U/L  ? AST 15 10 - 35 U/L  ? ALT 12 6 - 29 U/L  ?Lipid Profile  ?Result Value Ref Range  ? Cholesterol 229 (H) <200 mg/dL  ? HDL 81 > OR = 50 mg/dL  ? Triglycerides 122 <150 mg/dL  ? LDL Cholesterol (Calc) 125 (H) mg/dL (calc)  ? Total CHOL/HDL Ratio 2.8 <5.0 (calc)  ? Non-HDL Cholesterol (Calc) 148 (H) <130 mg/dL (calc)  ? ?   ?Assessment & Plan:  ? ?1. Synovial cyst of right popliteal space ?-continue current treatment ?- Ambulatory referral to Orthopedic Surgery  ? ?Follow up plan: ?Return if symptoms worsen or fail to improve. ? ? ? ? ? ?

## 2021-06-26 DIAGNOSIS — M1711 Unilateral primary osteoarthritis, right knee: Secondary | ICD-10-CM | POA: Diagnosis not present

## 2021-06-30 DIAGNOSIS — M25561 Pain in right knee: Secondary | ICD-10-CM | POA: Diagnosis not present

## 2021-06-30 DIAGNOSIS — M25661 Stiffness of right knee, not elsewhere classified: Secondary | ICD-10-CM | POA: Diagnosis not present

## 2021-07-12 DIAGNOSIS — M25561 Pain in right knee: Secondary | ICD-10-CM | POA: Diagnosis not present

## 2021-07-12 DIAGNOSIS — M25661 Stiffness of right knee, not elsewhere classified: Secondary | ICD-10-CM | POA: Diagnosis not present

## 2021-07-13 ENCOUNTER — Ambulatory Visit (INDEPENDENT_AMBULATORY_CARE_PROVIDER_SITE_OTHER): Payer: BC Managed Care – PPO

## 2021-07-13 DIAGNOSIS — Z1231 Encounter for screening mammogram for malignant neoplasm of breast: Secondary | ICD-10-CM | POA: Diagnosis not present

## 2021-07-13 DIAGNOSIS — Z0000 Encounter for general adult medical examination without abnormal findings: Secondary | ICD-10-CM

## 2021-07-13 DIAGNOSIS — Z780 Asymptomatic menopausal state: Secondary | ICD-10-CM

## 2021-07-13 NOTE — Patient Instructions (Signed)
[name] , ?Thank you for taking time to come for your Medicare Wellness Visit. I appreciate your ongoing commitment to your health goals. Please review the following plan we discussed and let me know if I can assist you in the future.  ? ?Screening recommendations/referrals: ?Colonoscopy: Cologuard done 06/04/21 ?Mammogram: done 11/30/20 Please call [phone] to schedule your mammogram and bone density screening ?Bone Density: done 09/26/15.   ? ?Recommended yearly ophthalmology/optometry visit for glaucoma screening and checkup ?Recommended yearly dental visit for hygiene and checkup ? ?Vaccinations: ?Influenza vaccine: declined ?Pneumococcal vaccine: declined ?Tdap vaccine: done 02/25/14 ?Shingles vaccine: Shingrix discussed. Please contact your pharmacy for coverage information.  ?Covid-19: done 05/30/19, 06/22/19, 04/12/20 ? ?Advanced directives: Please bring a copy of your health care power of attorney and living will to the office at your convenience.  ? ?Conditions/risks identified: Keep up the great work! ? ? ?Next appointment: Follow up in one year for your annual wellness visit  ? ? ?Preventive Care 48 Years and Older, Female ?Preventive care refers to lifestyle choices and visits with your health care provider that can promote health and wellness. ?What does preventive care include? ?A yearly physical exam. This is also called an annual well check. ?Dental exams once or twice a year. ?Routine eye exams. Ask your health care provider how often you should have your eyes checked. ?Personal lifestyle choices, including: ?Daily care of your teeth and gums. ?Regular physical activity. ?Eating a healthy diet. ?Avoiding tobacco and drug use. ?Limiting alcohol use. ?Practicing safe sex. ?Taking low-dose aspirin every day. ?Taking vitamin and mineral supplements as recommended by your health care provider. ?What happens during an annual well check? ?The services and screenings done by your health care provider during  your annual well check will depend on your age, overall health, lifestyle risk factors, and family history of disease. ?Counseling  ?Your health care provider may ask you questions about your: ?Alcohol use. ?Tobacco use. ?Drug use. ?Emotional well-being. ?Home and relationship well-being. ?Sexual activity. ?Eating habits. ?History of falls. ?Memory and ability to understand (cognition). ?Work and work Statistician. ?Reproductive health. ?Screening  ?You may have the following tests or measurements: ?Height, weight, and BMI. ?Blood pressure. ?Lipid and cholesterol levels. These may be checked every 5 years, or more frequently if you are over 21 years old. ?Skin check. ?Lung cancer screening. You may have this screening every year starting at age 70 if you have a 30-pack-year history of smoking and currently smoke or have quit within the past 15 years. ?Fecal occult blood test (FOBT) of the stool. You may have this test every year starting at age 17. ?Flexible sigmoidoscopy or colonoscopy. You may have a sigmoidoscopy every 5 years or a colonoscopy every 10 years starting at age 16. ?Hepatitis C blood test. ?Hepatitis B blood test. ?Sexually transmitted disease (STD) testing. ?Diabetes screening. This is done by checking your blood sugar (glucose) after you have not eaten for a while (fasting). You may have this done every 1-3 years. ?Bone density scan. This is done to screen for osteoporosis. You may have this done starting at age 35. ?Mammogram. This may be done every 1-2 years. Talk to your health care provider about how often you should have regular mammograms. ?Talk with your health care provider about your test results, treatment options, and if necessary, the need for more tests. ?Vaccines  ?Your health care provider may recommend certain vaccines, such as: ?Influenza vaccine. This is recommended every year. ?Tetanus, diphtheria, and acellular pertussis (Tdap,  Td) vaccine. You may need a Td booster every 10  years. ?Zoster vaccine. You may need this after age 4. ?Pneumococcal 13-valent conjugate (PCV13) vaccine. One dose is recommended after age 49. ?Pneumococcal polysaccharide (PPSV23) vaccine. One dose is recommended after age 7. ?Talk to your health care provider about which screenings and vaccines you need and how often you need them. ?This information is not intended to replace advice given to you by your health care provider. Make sure you discuss any questions you have with your health care provider. ?Document Released: 05/06/2015 Document Revised: 12/28/2015 Document Reviewed: 02/08/2015 ?Elsevier Interactive Patient Education ? 2017 [place]. ? ?Fall Prevention in the Home ?Falls can cause injuries. They can happen to people of all ages. There are many things you can do to make your home safe and to help prevent falls. ?What can I do on the outside of my home? ?Regularly fix the edges of walkways and driveways and fix any cracks. ?Remove anything that might make you trip as you walk through a door, such as a raised step or threshold. ?Trim any bushes or trees on the path to your home. ?Use bright outdoor lighting. ?Clear any walking paths of anything that might make someone trip, such as rocks or tools. ?Regularly check to see if handrails are loose or broken. Make sure that both sides of any steps have handrails. ?Any raised decks and porches should have guardrails on the edges. ?Have any leaves, snow, or ice cleared regularly. ?Use sand or salt on walking paths during winter. ?Clean up any spills in your garage right away. This includes oil or grease spills. ?What can I do in the bathroom? ?Use night lights. ?Install grab bars by the toilet and in the tub and shower. Do not use towel bars as grab bars. ?Use non-skid mats or decals in the tub or shower. ?If you need to sit down in the shower, use a plastic, non-slip stool. ?Keep the floor dry. Clean up any water that spills on the floor as soon as it  happens. ?Remove soap buildup in the tub or shower regularly. ?Attach bath mats securely with double-sided non-slip rug tape. ?Do not have throw rugs and other things on the floor that can make you trip. ?What can I do in the bedroom? ?Use night lights. ?Make sure that you have a light by your bed that is easy to reach. ?Do not use any sheets or blankets that are too big for your bed. They should not hang down onto the floor. ?Have a firm chair that has side arms. You can use this for support while you get dressed. ?Do not have throw rugs and other things on the floor that can make you trip. ?What can I do in the kitchen? ?Clean up any spills right away. ?Avoid walking on wet floors. ?Keep items that you use a lot in easy-to-reach places. ?If you need to reach something above you, use a strong step stool that has a grab bar. ?Keep electrical cords out of the way. ?Do not use floor polish or wax that makes floors slippery. If you must use wax, use non-skid floor wax. ?Do not have throw rugs and other things on the floor that can make you trip. ?What can I do with my stairs? ?Do not leave any items on the stairs. ?Make sure that there are handrails on both sides of the stairs and use them. Fix handrails that are broken or loose. Make sure that handrails are as  long as the stairways. ?Check any carpeting to make sure that it is firmly attached to the stairs. Fix any carpet that is loose or worn. ?Avoid having throw rugs at the top or bottom of the stairs. If you do have throw rugs, attach them to the floor with carpet tape. ?Make sure that you have a light switch at the top of the stairs and the bottom of the stairs. If you do not have them, ask someone to add them for you. ?What else can I do to help prevent falls? ?Wear shoes that: ?Do not have high heels. ?Have rubber bottoms. ?Are comfortable and fit you well. ?Are closed at the toe. Do not wear sandals. ?If you use a stepladder: ?Make sure that it is fully opened.  Do not climb a closed stepladder. ?Make sure that both sides of the stepladder are locked into place. ?Ask someone to hold it for you, if possible. ?Clearly mark and make sure that you can see: ?Any grab ba

## 2021-07-13 NOTE — Progress Notes (Signed)
? ?Subjective:  ? Sherry Lowe is a 71 y.o. female who presents for Medicare Annual (Subsequent) preventive examination. ? ?Virtual Visit via Telephone Note ? ?I connected with  Sherry Lowe on 07/13/21 at 10:00 AM EDT by telephone and verified that I am speaking with the correct person using two identifiers. ? ?Location: ?Patient: home ?Provider: [facility] ?Persons participating in the virtual visit: patient/Nurse Health Advisor ?  ?I discussed the limitations, risks, security and privacy concerns of performing an evaluation and management service by telephone and the availability of in person appointments. The patient expressed understanding and agreed to proceed. ? ?Interactive audio and video telecommunications were attempted between this nurse and patient, however failed, due to patient having technical difficulties OR patient did not have access to video capability.  We continued and completed visit with audio only. ? ?Some vital signs may be absent or patient reported.  ? ?Reather Littler, LPN ? ? ?Review of Systems    ? ?Cardiac Risk Factors include: advanced age (>31men, >1 women) ? ?   ?Objective:  ?  ?Today's Vitals  ? 07/13/21 1008  ?PainSc: 3   ? ?There is no height or weight on file to calculate BMI. ? ? ?  07/13/2021  ? 10:13 AM 04/04/2020  ?  1:25 PM  ?Advanced Directives  ?Does Patient Have a Medical Advance Directive? Yes No  ?Type of Estate agent of [place];Living will   ?Copy of Healthcare Power of Attorney in Chart? No - copy requested   ? ? ?Current Medications (verified) ?Outpatient Encounter Medications as of 07/13/2021  ?Medication Sig  ? [DISCONTINUED] aspirin 81 MG tablet Take 81 mg by mouth daily. (Patient not taking: Reported on 06/22/2021)  ? [DISCONTINUED] phenazopyridine (PYRIDIUM) 200 MG tablet Take 1 tablet (200 mg total) by mouth 3 (three) times daily. (Patient not taking: Reported on 05/24/2021)  ? ?No facility-administered encounter medications on file as of  07/13/2021.  ? ? ?Allergies (verified) ?Alprazolam, Sulfa antibiotics, Sulfa drugs cross reactors, and Zithromax [azithromycin]  ? ?History: ?Past Medical History:  ?Diagnosis Date  ? Arthritis   ? H/O bilateral oophorectomy 1985  ? along with hysterectomy  ? H/O cardiac catheterization 2005  ? reportedly normal per patient  ? History of bone density study   ? 2012, 2017 normal  ? History of UTI   ? Obesity   ? S/P hysterectomy 1995  ? Seasonal allergies   ? Shingles 2014  ? Wears glasses   ? ?Past Surgical History:  ?Procedure Laterality Date  ? CARDIAC CATHETERIZATION  1995  ? reportedly normal per patient.    ? COLONOSCOPY  2013  ? normal per patient  ? VAGINAL HYSTERECTOMY  1985  ? total, due to fibroids  ? WISDOM TOOTH EXTRACTION    ? ?Family History  ?Problem Relation Age of Onset  ? Hyperlipidemia Father   ?     Living, 83  ? Cancer Father   ?     prostate  ? Hypertension Father   ? Hyperlipidemia Mother   ?     Living 81  ? Healthy Brother   ?     x2  ? Cancer Maternal Grandmother   ?     stomach  ? Stroke Maternal Grandfather   ? ?Social History  ? ?Socioeconomic History  ? Marital status: Divorced  ?  Spouse name: Not on file  ? Number of children: 1  ? Years of education: Not on file  ? Highest  education level: Not on file  ?Occupational History  ? Not on file  ?Tobacco Use  ? Smoking status: Never  ? Smokeless tobacco: Never  ?Vaping Use  ? Vaping Use: Never used  ?Substance and Sexual Activity  ? Alcohol use: No  ? Drug use: No  ? Sexual activity: Yes  ?  Partners: Male  ?Other Topics Concern  ? Not on file  ?Social History Narrative  ? She lives alone.  She has one grown son.  ? 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     ? Was prior working as a Arts development officer as a Acupuncturist.    ? Works in Educational psychologist at [employer]  ? Highest level of education:  B.S.  ? [employer]  ? ?Social Determinants of Health  ? ?Financial Resource Strain: Low Risk   ? Difficulty of  Paying Living Expenses: Not very hard  ?Food Insecurity: No Food Insecurity  ? Worried About Programme researcher, broadcasting/film/video in the Last Year: Never true  ? Ran Out of Food in the Last Year: Never true  ?Transportation Needs: No Transportation Needs  ? Lack of Transportation (Medical): No  ? Lack of Transportation (Non-Medical): No  ?Physical Activity: Sufficiently Active  ? Days of Exercise per Week: 4 days  ? Minutes of Exercise per Session: 60 min  ?Stress: No Stress Concern Present  ? Feeling of Stress : Not at all  ?Social Connections: Moderately Integrated  ? Frequency of Communication with Friends and Family: More than three times a week  ? Frequency of Social Gatherings with Friends and Family: Twice a week  ? Attends Religious Services: More than 4 times per year  ? Active Member of Clubs or Organizations: Yes  ? Attends Banker Meetings: More than 4 times per year  ? Marital Status: Divorced  ? ? ?Tobacco Counseling ?Counseling given: Not Answered ? ? ?Clinical Intake: ? ?Pre-visit preparation completed: Yes ? ?Pain : 0-10 ?Pain Score: 3  ?Pain Type: Chronic pain ?Pain Location: Knee ?Pain Orientation: Right ?Pain Descriptors / Indicators: Aching, Sore ?Pain Onset: More than a month ago ?Pain Frequency: Intermittent ? ?  ? ?Nutritional Status: BMI > 30  Obese ?Nutritional Risks: None ?Diabetes: No ? ?How often do you need to have someone help you when you read instructions, pamphlets, or other written materials from your doctor or pharmacy?: 1 - Never ? ? ? ?Interpreter Needed?: No ? ?Information entered by :: Reather Littler LPN ? ? ?Activities of Daily Living ? ?  07/13/2021  ? 10:14 AM 07/09/2021  ?  8:43 AM  ?In your present state of health, do you have any difficulty performing the following activities:  ?Hearing? 0 0  ?Vision? 0 0  ?Difficulty concentrating or making decisions? 0 0  ?Walking or climbing stairs? 1 1  ?Dressing or bathing? 0 0  ?Doing errands, shopping? 0 0  ?Preparing Food and eating ? N N   ?Using the Toilet? N N  ?In the past six months, have you accidently leaked urine? Y N  ?Do you have problems with loss of bowel control? N N  ?Managing your Medications? N N  ?Managing your Finances? N N  ?Housekeeping or managing your Housekeeping? N N  ? ? ?Patient Care Team: ?Margarita Mail, DO as PCP - General (Internal Medicine) ? ?Indicate any recent Medical Services you may have received from other than [facility] providers in the past year (date may be approximate). ? ?   ?  Assessment:  ? This is a routine wellness examination for [facility]. ? ?Hearing/Vision screen ?Hearing Screening - Comments:: Pt denies hearing difficulty ?Vision Screening - Comments:: [facility]  ? ?Dietary issues and exercise activities discussed: ?Current Exercise Habits: Structured exercise class, Type of exercise: Other - see comments (water aerobics, physical therapy), Time (Minutes): 60, Frequency (Times/Week): 4, Weekly Exercise (Minutes/Week): 240, Intensity: Moderate, Exercise limited by: orthopedic condition(s) ? ? Goals Addressed   ? ?  ?  ?  ?  ? This Visit's Progress  ?  Exercise 3x per week (30 min per time)   On track  ? ?  ? ?Depression Screen ? ?  07/13/2021  ? 10:12 AM 06/22/2021  ?  1:07 PM 05/24/2021  ?  8:16 AM 06/20/2018  ?  2:54 PM 12/20/2016  ? 12:08 PM  ?PHQ 2/9 Scores  ?PHQ - 2 Score 0 0 0 0 0  ?PHQ- 9 Score   0    ?  ?Fall Risk ? ?  07/13/2021  ? 10:14 AM 07/09/2021  ?  8:43 AM 06/22/2021  ?  1:07 PM 05/24/2021  ?  8:16 AM 06/20/2018  ?  2:53 PM  ?Fall Risk   ?Falls in the past year? 0 0 0 0 1  ?Number falls in past yr: 0 0 0 0 0  ?Injury with Fall? 0 0 0 0 0  ?Risk for fall due to : No Fall Risks      ?Follow up Falls prevention discussed  Falls evaluation completed Falls evaluation completed Falls evaluation completed  ? ? ?FALL RISK PREVENTION PERTAINING TO THE HOME: ? ?Any stairs in or around the home? Yes  ?If so, are there any without handrails? No  ?Home free of loose throw rugs  in walkways, pet beds, electrical cords, etc? Yes  ?Adequate lighting in your home to reduce risk of falls? Yes  ? ?ASSISTIVE DEVICES UTILIZED TO PREVENT FALLS: ? ?Life alert? No  ?Use of a cane, walker or w/c

## 2021-07-14 DIAGNOSIS — M25661 Stiffness of right knee, not elsewhere classified: Secondary | ICD-10-CM | POA: Diagnosis not present

## 2021-07-14 DIAGNOSIS — M25561 Pain in right knee: Secondary | ICD-10-CM | POA: Diagnosis not present

## 2021-07-19 DIAGNOSIS — M25561 Pain in right knee: Secondary | ICD-10-CM | POA: Diagnosis not present

## 2021-07-19 DIAGNOSIS — M25661 Stiffness of right knee, not elsewhere classified: Secondary | ICD-10-CM | POA: Diagnosis not present

## 2021-07-26 DIAGNOSIS — M25561 Pain in right knee: Secondary | ICD-10-CM | POA: Diagnosis not present

## 2021-07-26 DIAGNOSIS — M25661 Stiffness of right knee, not elsewhere classified: Secondary | ICD-10-CM | POA: Diagnosis not present

## 2021-07-31 DIAGNOSIS — M25561 Pain in right knee: Secondary | ICD-10-CM | POA: Diagnosis not present

## 2021-07-31 DIAGNOSIS — M25661 Stiffness of right knee, not elsewhere classified: Secondary | ICD-10-CM | POA: Diagnosis not present

## 2021-08-02 DIAGNOSIS — M25561 Pain in right knee: Secondary | ICD-10-CM | POA: Diagnosis not present

## 2021-08-02 DIAGNOSIS — M25661 Stiffness of right knee, not elsewhere classified: Secondary | ICD-10-CM | POA: Diagnosis not present

## 2021-08-09 DIAGNOSIS — M25661 Stiffness of right knee, not elsewhere classified: Secondary | ICD-10-CM | POA: Diagnosis not present

## 2021-08-09 DIAGNOSIS — M25561 Pain in right knee: Secondary | ICD-10-CM | POA: Diagnosis not present

## 2021-08-14 DIAGNOSIS — M25561 Pain in right knee: Secondary | ICD-10-CM | POA: Diagnosis not present

## 2021-08-22 DIAGNOSIS — M25561 Pain in right knee: Secondary | ICD-10-CM | POA: Diagnosis not present

## 2021-08-28 DIAGNOSIS — S83206A Unspecified tear of unspecified meniscus, current injury, right knee, initial encounter: Secondary | ICD-10-CM | POA: Diagnosis not present

## 2021-09-04 DIAGNOSIS — M25661 Stiffness of right knee, not elsewhere classified: Secondary | ICD-10-CM | POA: Diagnosis not present

## 2021-09-04 DIAGNOSIS — M25561 Pain in right knee: Secondary | ICD-10-CM | POA: Diagnosis not present

## 2021-09-13 DIAGNOSIS — M25561 Pain in right knee: Secondary | ICD-10-CM | POA: Diagnosis not present

## 2021-09-13 DIAGNOSIS — M25661 Stiffness of right knee, not elsewhere classified: Secondary | ICD-10-CM | POA: Diagnosis not present

## 2021-10-04 DIAGNOSIS — M25661 Stiffness of right knee, not elsewhere classified: Secondary | ICD-10-CM | POA: Diagnosis not present

## 2021-10-04 DIAGNOSIS — M25561 Pain in right knee: Secondary | ICD-10-CM | POA: Diagnosis not present

## 2021-10-06 DIAGNOSIS — M25561 Pain in right knee: Secondary | ICD-10-CM | POA: Diagnosis not present

## 2021-10-06 DIAGNOSIS — M25661 Stiffness of right knee, not elsewhere classified: Secondary | ICD-10-CM | POA: Diagnosis not present

## 2021-10-06 DIAGNOSIS — M1711 Unilateral primary osteoarthritis, right knee: Secondary | ICD-10-CM | POA: Diagnosis not present

## 2021-11-02 DIAGNOSIS — M25561 Pain in right knee: Secondary | ICD-10-CM | POA: Diagnosis not present

## 2021-11-02 DIAGNOSIS — M25661 Stiffness of right knee, not elsewhere classified: Secondary | ICD-10-CM | POA: Diagnosis not present

## 2021-11-09 DIAGNOSIS — M25561 Pain in right knee: Secondary | ICD-10-CM | POA: Diagnosis not present

## 2021-11-09 DIAGNOSIS — M25661 Stiffness of right knee, not elsewhere classified: Secondary | ICD-10-CM | POA: Diagnosis not present

## 2021-11-16 DIAGNOSIS — M25561 Pain in right knee: Secondary | ICD-10-CM | POA: Diagnosis not present

## 2021-11-16 DIAGNOSIS — M25661 Stiffness of right knee, not elsewhere classified: Secondary | ICD-10-CM | POA: Diagnosis not present

## 2021-11-21 ENCOUNTER — Ambulatory Visit: Payer: BC Managed Care – PPO | Admitting: Internal Medicine

## 2021-12-14 DIAGNOSIS — M25561 Pain in right knee: Secondary | ICD-10-CM | POA: Diagnosis not present

## 2021-12-14 DIAGNOSIS — M25661 Stiffness of right knee, not elsewhere classified: Secondary | ICD-10-CM | POA: Diagnosis not present

## 2021-12-21 DIAGNOSIS — M25661 Stiffness of right knee, not elsewhere classified: Secondary | ICD-10-CM | POA: Diagnosis not present

## 2021-12-21 DIAGNOSIS — M25561 Pain in right knee: Secondary | ICD-10-CM | POA: Diagnosis not present

## 2022-01-11 DIAGNOSIS — M25561 Pain in right knee: Secondary | ICD-10-CM | POA: Diagnosis not present

## 2022-01-11 DIAGNOSIS — M25661 Stiffness of right knee, not elsewhere classified: Secondary | ICD-10-CM | POA: Diagnosis not present

## 2022-01-18 DIAGNOSIS — M25561 Pain in right knee: Secondary | ICD-10-CM | POA: Diagnosis not present

## 2022-01-18 DIAGNOSIS — M25661 Stiffness of right knee, not elsewhere classified: Secondary | ICD-10-CM | POA: Diagnosis not present

## 2022-01-23 NOTE — Progress Notes (Unsigned)
   Acute Office Visit  Subjective:     Patient ID: KRISLYNN GRONAU, female    DOB: 1950/10/09, 71 y.o.   MRN: [id number]  No chief complaint on file.   HPI Patient is in today for swelling in left foot.   FOOT PAIN Duration: {Blank single:19197::"chronic","days","weeks","months"} Involved foot: {Blank single:19197::"left","right","bilateral"} Mechanism of injury: {Blank single:19197::"trauma","unknown"} Location:  Onset: {Blank single:19197::"sudden","gradual"}  Severity: {Blank single:19197::"mild","moderate","severe","1/10","2/10","3/10","4/10","5/10","6/10","7/10","8/10","9/10","10/10"}  Quality:  {Blank multiple:19196::"sharp","dull","aching","burning","cramping","ill-defined","itchy","pressure-like","pulling","shooting","sore","stabbing","tender","tearing","throbbing"} Frequency: {Blank single:19197::"constant","intermittent","occasional","rare","every few minutes","a few times a hour","a few times a day","a few times a week","a few times a month","a few times a year"} Radiation: {Blank single:19197::"yes","no"} Aggravating factors: {Blank multiple:19196::"weight bearing","walking","running","stairs","bending","movement","prolonged sitting"}  Alleviating factors: {Blank multiple:19196::"nothing","ice","physical therapy","HEP","APAP","NSAIDs","brace","crutches","rest"}  Status: {Blank multiple:19196::"better","worse","stable","fluctuating"} Treatments attempted: {Blank multiple:19196::"none","rest","ice","heat","APAP","ibuprofen","aleve","physical therapy","HEP"}  Relief with NSAIDs?:  {Blank single:19197::"No NSAIDs Taken","no","mild","moderate","significant"} Weakness with weight bearing or walking: {Blank single:19197::"yes","no"} Morning stiffness: {Blank single:19197::"yes","no"} Swelling: {Blank single:19197::"yes","no"} Redness: {Blank single:19197::"yes","no"} Bruising: {Blank single:19197::"yes","no"} Paresthesias / decreased sensation: {Blank single:19197::"yes","no"}   Fevers:{Blank single:19197::"yes","no"}   ROS      Objective:    There were no vitals taken for this visit. {Vitals History (Optional):23777}  Physical Exam  No results found for any visits on 01/24/22.      Assessment & Plan:   Problem List Items Addressed This Visit   None   No orders of the defined types were placed in this encounter.   No follow-ups on file.  Teodora Medici, DO

## 2022-01-24 ENCOUNTER — Ambulatory Visit (INDEPENDENT_AMBULATORY_CARE_PROVIDER_SITE_OTHER): Payer: BC Managed Care – PPO | Admitting: Internal Medicine

## 2022-01-24 ENCOUNTER — Telehealth: Payer: Self-pay

## 2022-01-24 ENCOUNTER — Ambulatory Visit
Admission: RE | Admit: 2022-01-24 | Discharge: 2022-01-24 | Disposition: A | Discharge status: Home / Self Care | Payer: BC Managed Care – PPO | Source: Ambulatory Visit | Attending: Internal Medicine | Admitting: Internal Medicine

## 2022-01-24 ENCOUNTER — Encounter: Payer: Self-pay | Admitting: Internal Medicine

## 2022-01-24 VITALS — BP 124/74 | HR 100 | Temp 97.5°F | Resp 16 | Ht 65.0 in | Wt 242.7 lb

## 2022-01-24 DIAGNOSIS — M7989 Other specified soft tissue disorders: Secondary | ICD-10-CM | POA: Diagnosis not present

## 2022-01-24 NOTE — Patient Instructions (Signed)
It was great seeing you today!  Plan discussed at today's visit: -Ultrasound today to rule out blood clot in the left leg -For generalized swelling, recommend decreased sodium in the diet, elevating legs to the level of the heart and wearing compression stockings while at work (do not sleep in them) -If swelling worsens or fails to improve let me know and we can start and as needed medication  Follow up in: as needed  Take care and let us know if you have any questions or concerns prior to your next visit.  Dr. Caralee Ates  Edema  Edema is an abnormal buildup of fluids in the body tissues and under the skin. Swelling of the legs, feet, and ankles is a common symptom that becomes more likely as you get older. Swelling is also common in looser tissues, such as around the eyes. Pressing on the area may make a temporary dent in your skin (pitting edema). This fluid may also accumulate in your lungs (pulmonary edema). There are many possible causes of edema. Eating too much salt (sodium) and being on your feet or sitting for a long time can cause edema in your legs, feet, and ankles. Common causes of edema include: Certain medical conditions, such as heart failure, liver or kidney disease, and cancer. Weak leg blood vessels. An injury. Pregnancy. Medicines. Being obese. Low protein levels in the blood. Hot weather may make edema worse. Edema is usually painless. Your skin may look swollen or shiny. Follow these instructions at home: Medicines Take over-the-counter and prescription medicines only as told by your health care provider. Your health care provider may prescribe a medicine to help your body get rid of extra water (diuretic). Take this medicine if you are told to take it. Eating and drinking Eat a low-salt (low-sodium) diet to reduce fluid as told by your health care provider. Sometimes, eating less salt may reduce swelling. Depending on the cause of your swelling, you may need to  limit how much fluid you drink (fluid restriction). General instructions Raise (elevate) the injured area above the level of your heart while you are sitting or lying down. Do not sit still or stand for long periods of time. Do not wear tight clothing. Do not wear garters on your upper legs. Exercise your legs to get your circulation going. This helps to move the fluid back into your blood vessels, and it may help the swelling go down. Wear compression stockings as told by your health care provider. These stockings help to prevent blood clots and reduce swelling in your legs. It is important that these are the correct size. These stockings should be prescribed by your health care provider to prevent possible injuries. If elastic bandages or wraps are recommended, use them as told by your health care provider. Contact a health care provider if: Your edema does not get better with treatment. You have heart, liver, or kidney disease and have symptoms of edema. You have sudden and unexplained weight gain. Get help right away if: You develop shortness of breath or chest pain. You cannot breathe when you lie down. You develop pain, redness, or warmth in the swollen areas. You have heart, liver, or kidney disease and suddenly get edema. You have a fever and your symptoms suddenly get worse. These symptoms may be an emergency. Get help right away. Call 911. Do not wait to see if the symptoms will go away. Do not drive yourself to the hospital. Summary Edema is an abnormal buildup of  fluids in the body tissues and under the skin. Eating too much salt (sodium)and being on your feet or sitting for a long time can cause edema in your legs, feet, and ankles. Raise (elevate) the injured area above the level of your heart while you are sitting or lying down. Follow your health care provider's instructions about diet and how much fluid you can drink. This information is not intended to replace advice given  to you by your health care provider. Make sure you discuss any questions you have with your health care provider. Document Revised: 12/12/2020 Document Reviewed: 12/12/2020 Elsevier Patient Education  [place].

## 2022-01-24 NOTE — Telephone Encounter (Signed)
Radiologist called to report that STAT US was negative for DVT.

## 2022-01-25 DIAGNOSIS — M25661 Stiffness of right knee, not elsewhere classified: Secondary | ICD-10-CM | POA: Diagnosis not present

## 2022-01-25 DIAGNOSIS — M25561 Pain in right knee: Secondary | ICD-10-CM | POA: Diagnosis not present

## 2022-02-01 DIAGNOSIS — M25561 Pain in right knee: Secondary | ICD-10-CM | POA: Diagnosis not present

## 2022-02-01 DIAGNOSIS — M25661 Stiffness of right knee, not elsewhere classified: Secondary | ICD-10-CM | POA: Diagnosis not present

## 2022-02-08 DIAGNOSIS — M25661 Stiffness of right knee, not elsewhere classified: Secondary | ICD-10-CM | POA: Diagnosis not present

## 2022-02-08 DIAGNOSIS — M25561 Pain in right knee: Secondary | ICD-10-CM | POA: Diagnosis not present

## 2022-02-21 ENCOUNTER — Ambulatory Visit
Admission: RE | Admit: 2022-02-21 | Discharge: 2022-02-21 | Disposition: A | Discharge status: Home / Self Care | Payer: BC Managed Care – PPO | Source: Ambulatory Visit | Attending: Internal Medicine | Admitting: Internal Medicine

## 2022-02-21 DIAGNOSIS — Z1231 Encounter for screening mammogram for malignant neoplasm of breast: Secondary | ICD-10-CM

## 2022-07-19 ENCOUNTER — Ambulatory Visit (INDEPENDENT_AMBULATORY_CARE_PROVIDER_SITE_OTHER): Payer: BC Managed Care – PPO

## 2022-07-19 VITALS — Ht 67.0 in | Wt 242.0 lb

## 2022-07-19 DIAGNOSIS — Z0000 Encounter for general adult medical examination without abnormal findings: Secondary | ICD-10-CM

## 2022-07-19 NOTE — Progress Notes (Signed)
I connected with  Sherry Lowe on 07/19/22 by a audio enabled telemedicine application and verified that I am speaking with the correct person using two identifiers.  Patient Location: Home  Provider Location: Office/Clinic  I discussed the limitations of evaluation and management by telemedicine. The patient expressed understanding and agreed to proceed.  Subjective:   Sherry Lowe is a 72 y.o. female who presents for Medicare Annual (Subsequent) preventive examination.  Review of Systems    Cardiac Risk Factors include: advanced age (>22men, >11 women);sedentary lifestyle;obesity (BMI >30kg/m2)    Objective:    Today's Vitals   07/19/22 0846  Weight: 242 lb (109.8 kg)  Height: 5\' 7"  (1.702 m)   Body mass index is 37.9 kg/m.     07/19/2022    8:53 AM 07/13/2021   10:13 AM 04/04/2020    1:25 PM  Advanced Directives  Does Patient Have a Medical Advance Directive? Yes Yes No  Type of Paramedic of [place];Living will [place];Living will   Copy of [place] in Chart? No - copy requested No - copy requested     Current Medications (verified) No outpatient encounter medications on file as of 07/19/2022.   No facility-administered encounter medications on file as of 07/19/2022.    Allergies (verified) Alprazolam, Sulfa antibiotics, Sulfa drugs cross reactors, and Zithromax [azithromycin]   History: Past Medical History:  Diagnosis Date   Arthritis    H/O bilateral oophorectomy 1985   along with hysterectomy   H/O cardiac catheterization 2005   reportedly normal per patient   History of bone density study    2012, 2017 normal   History of UTI    Obesity    S/P hysterectomy 1995   Seasonal allergies    Shingles 2014   Wears glasses    Past Surgical History:  Procedure Laterality Date   [place]   reportedly normal per patient.     COLONOSCOPY  2013   normal per patient    VAGINAL HYSTERECTOMY  1985   total, due to fibroids   WISDOM TOOTH EXTRACTION     Family History  Problem Relation Age of Onset   Hyperlipidemia Father        Living, 30   Cancer Father        prostate   Hypertension Father    Hyperlipidemia Mother        Living 12   Healthy Brother        x2   Cancer Maternal Grandmother        stomach   Stroke Maternal Grandfather    Social History   Socioeconomic History   Marital status: Divorced    Spouse name: Not on file   Number of children: 1   Years of education: Not on file   Highest education level: Not on file  Occupational History   Not on file  Tobacco Use   Smoking status: Never   Smokeless tobacco: Never  Vaping Use   Vaping Use: Never used  Substance and Sexual Activity   Alcohol use: No   Drug use: No   Sexual activity: Yes    Partners: Male  Other Topics Concern   Not on file  Social History Narrative   She lives alone.  She has one grown son.   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     Works in Youth worker at [employer] level of education:  B.S.   [employer]   Social Determinants of Health   Financial Resource Strain: Low Risk  (07/18/2022)   Overall Financial Resource Strain (CARDIA)    Difficulty of Paying Living Expenses: Not hard at all  Food Insecurity: No Food Insecurity (07/18/2022)   Hunger Vital Sign    Worried About Running Out of Food in the Last Year: Never true    Ran Out of Food in the Last Year: Never true  Transportation Needs: No Transportation Needs (07/18/2022)   PRAPARE - Hydrologist (Medical): No    Lack of Transportation (Non-Medical): No  Physical Activity: Insufficiently Active (07/18/2022)   Exercise Vital Sign    Days of Exercise per Week: 1 day    Minutes of Exercise per Session: 10 min  Stress: No Stress Concern Present (07/18/2022)    [place]    Feeling of Stress : Not at all  Social Connections: Unknown (07/18/2022)   Social Connection and Isolation Panel [NHANES]    Frequency of Communication with Friends and Family: More than three times a week    Frequency of Social Gatherings with Friends and Family: Twice a week    Attends Religious Services: Not on Advertising copywriter or Organizations: Yes    Attends Music therapist: More than 4 times per year    Marital Status: Divorced    Tobacco Counseling Counseling given: Not Answered   Clinical Intake:  Pre-visit preparation completed: Yes  Pain : No/denies pain     BMI - recorded: 37.9 Nutritional Status: BMI > 30  Obese Nutritional Risks: None Diabetes: No  How often do you need to have someone help you when you read instructions, pamphlets, or other written materials from your doctor or pharmacy?: 1 - Never  Diabetic?no  Interpreter Needed?: No  Information entered by :: B.Vegas Fritze,LPN   Activities of Daily Living    07/19/2022    9:00 AM 07/18/2022    9:37 AM  In your present state of health, do you have any difficulty performing the following activities:  Hearing? 0 0  Vision? 0 0  Difficulty concentrating or making decisions? 0 0  Walking or climbing stairs?  1  Dressing or bathing? 0 0  Doing errands, shopping? 0 0  Preparing Food and eating ? N N  Using the Toilet? N N  In the past six months, have you accidently leaked urine? N N  Do you have problems with loss of bowel control? N N  Managing your Medications? N N  Managing your Finances? N N  Housekeeping or managing your Housekeeping? N N    Patient Care Team: Teodora Medici, DO as PCP - General (Internal Medicine)  Indicate any recent Medical Services you may have received from other than [facility] providers in the past year (date may be approximate).     Assessment:   This is a  routine wellness examination for [facility].  Hearing/Vision screen Hearing Screening - Comments:: Adequate hearing Vision Screening - Comments:: Adequate vision w/glasses [facility]  Dietary issues and exercise activities discussed: Current Exercise Habits: The patient does not participate in regular exercise at present, Exercise limited by: None identified   Goals Addressed             This Visit's Progress  Exercise 3x per week (30 min per time)   On track    Reduce sugar intake to 40 grams per day   On track      Depression Screen    07/19/2022    8:49 AM 01/24/2022   10:54 AM 07/13/2021   10:12 AM 06/22/2021    1:07 PM 05/24/2021    8:16 AM 06/20/2018    2:54 PM 12/20/2016   12:08 PM  PHQ 2/9 Scores  PHQ - 2 Score 0 0 0 0 0 0 0  PHQ- 9 Score  0   0      Fall Risk    07/18/2022    9:37 AM 01/24/2022   10:54 AM 07/13/2021   10:14 AM 07/09/2021    8:43 AM 06/22/2021    1:07 PM  Fall Risk   Falls in the past year? 0 0 0 0 0  Number falls in past yr: 0 0 0 0 0  Injury with Fall? 0 0 0 0 0  Risk for fall due to :   No Fall Risks    Follow up   Falls prevention discussed  Falls evaluation completed    FALL RISK PREVENTION PERTAINING TO THE HOME:  Any stairs in or around the home? Yes  If so, are there any without handrails? Yes  Home free of loose throw rugs in walkways, pet beds, electrical cords, etc? yes Adequate lighting in your home to reduce risk of falls? Yes   ASSISTIVE DEVICES UTILIZED TO PREVENT FALLS:  Life alert? No  Use of a cane, walker or w/c? No  Grab bars in the bathroom? No  Shower chair or bench in shower? No  Elevated toilet seat or a handicapped toilet? Yes    Cognitive Function:        07/19/2022    8:54 AM  6CIT Screen  What Year? 0 points  What month? 0 points  What time? 0 points  Count back from 20 0 points  Months in reverse 0 points  Repeat phrase 0 points  Total Score 0 points    Immunizations Immunization History   Administered Date(s) Administered   Hepatitis B, ADULT 08/24/2015, 09/28/2015, 03/05/2016   PFIZER(Purple Top)SARS-COV-2 Vaccination 05/30/2019, 06/22/2019, 04/12/2020   PPD Test 08/22/2015   Tdap 02/25/2014    TDAP status: Up to date  Flu Vaccine status: Declined, Education has been provided regarding the importance of this vaccine but patient still declined. Advised may receive this vaccine at local pharmacy or Health Dept. Aware to provide a copy of the vaccination record if obtained from local pharmacy or Health Dept. Verbalized acceptance and understanding.  Pneumococcal vaccine status: Declined,  Education has been provided regarding the importance of this vaccine but patient still declined. Advised may receive this vaccine at local pharmacy or Health Dept. Aware to provide a copy of the vaccination record if obtained from local pharmacy or Health Dept. Verbalized acceptance and understanding.   Covid-19 vaccine status: Declined, Education has been provided regarding the importance of this vaccine but patient still declined. Advised may receive this vaccine at local pharmacy or Health Dept.or vaccine clinic. Aware to provide a copy of the vaccination record if obtained from local pharmacy or Health Dept. Verbalized acceptance and understanding.  Qualifies for Shingles Vaccine? Yes   Zostavax completed No   Shingrix Completed?: No.    Education has been provided regarding the importance of this vaccine. Patient has been advised to call insurance company to determine out of  pocket expense if they have not yet received this vaccine. Advised may also receive vaccine at local pharmacy or Health Dept. Verbalized acceptance and understanding.  Screening Tests Health Maintenance  Topic Date Due   Zoster Vaccines- Shingrix (1 of 2) Never done   Pneumonia Vaccine 42+ Years old (1 of 1 - PCV) Never done   COVID-19 Vaccine (4 - 2023-24 season) 12/22/2021   INFLUENZA VACCINE  07/22/2022  (Originally 11/21/2021)   Medicare Annual Wellness (AWV)  07/19/2023   MAMMOGRAM  02/22/2024   DTaP/Tdap/Td (2 - Td or Tdap) 02/26/2024   Fecal DNA (Cologuard)  06/04/2024   DEXA SCAN  Completed   Hepatitis C Screening  Completed   HPV VACCINES  Aged Out   COLONOSCOPY (Pts 45-65yrs Insurance coverage will need to be confirmed)  Discontinued    Health Maintenance  Health Maintenance Due  Topic Date Due   Zoster Vaccines- Shingrix (1 of 2) Never done   Pneumonia Vaccine 18+ Years old (1 of 1 - PCV) Never done   COVID-19 Vaccine (4 - 2023-24 season) 12/22/2021    Colorectal cancer screening: Type of screening: Cologuard. Completed yes. Repeat every 3 years last done 2023  Mammogram status: Completed yes. Repeat every year  Bone Density- will do with next mammogram in Nov 2024  Lung Cancer Screening: (Low Dose CT Chest recommended if Age 58-80 years, 30 pack-year currently smoking OR have quit w/in 15years.) does not qualify.   Lung Cancer Screening Referral: no  Additional Screening:  Hepatitis C Screening: does not qualify; Completed yes  Vision Screening: Recommended annual ophthalmology exams for early detection of glaucoma and other disorders of the eye. Is the patient up to date with their annual eye exam?  Yes  Who is the provider or what is the name of the office in which the patient attends annual eye exams? [facility] If pt is not established with a provider, would they like to be referred to a provider to establish care? No .   Dental Screening: Recommended annual dental exams for proper oral hygiene  Community Resource Referral / Chronic Care Management: CRR required this visit?  No   CCM required this visit?  No      Plan:     I have personally reviewed and noted the following in the patient's chart:   Medical and social history Use of alcohol, tobacco or illicit drugs  Current medications and supplements including opioid prescriptions. Patient is not  currently taking opioid prescriptions. Functional ability and status Nutritional status Physical activity Advanced directives List of other physicians Hospitalizations, surgeries, and ER visits in previous 12 months Vitals Screenings to include cognitive, depression, and falls Referrals and appointments  In addition, I have reviewed and discussed with patient certain preventive protocols, quality metrics, and best practice recommendations. A written personalized care plan for preventive services as well as general preventive health recommendations were provided to patient.     Roger Shelter, LPN   [id number]   Nurse Notes: pt states she is doing well. She has no concerns or questions during the visit

## 2022-07-19 NOTE — Patient Instructions (Signed)
Sherry Lowe , Thank you for taking time to come for your Medicare Wellness Visit. I appreciate your ongoing commitment to your health goals. Please review the following plan we discussed and let me know if I can assist you in the future.   These are the goals we discussed:  Goals      Exercise 3x per week (30 min per time)     Reduce sugar intake to 40 grams per day        This is a list of the screening recommended for you and due dates:  Health Maintenance  Topic Date Due   Zoster (Shingles) Vaccine (1 of 2) Never done   Pneumonia Vaccine (1 of 1 - PCV) Never done   COVID-19 Vaccine (4 - 2023-24 season) 12/22/2021   Flu Shot  07/22/2022*   Medicare Annual Wellness Visit  07/19/2023   Mammogram  02/22/2024   DTaP/Tdap/Td vaccine (2 - Td or Tdap) 02/26/2024   Cologuard (Stool DNA test)  06/04/2024   DEXA scan (bone density measurement)  Completed   Hepatitis C Screening: USPSTF Recommendation to screen - Ages 19-79 yo.  Completed   HPV Vaccine  Aged Out   Colon Cancer Screening  Discontinued  *Topic was postponed. The date shown is not the original due date.    Advanced directives: yes  Conditions/risks identified: none  Next appointment: Follow up in one year for your annual wellness visit 07/25/2023@8 :45 am telephone   Preventive Care 65 Years and Older, Female Preventive care refers to lifestyle choices and visits with your health care provider that can promote health and wellness. What does preventive care include? A yearly physical exam. This is also called an annual well check. Dental exams once or twice a year. Routine eye exams. Ask your health care provider how often you should have your eyes checked. Personal lifestyle choices, including: Daily care of your teeth and gums. Regular physical activity. Eating a healthy diet. Avoiding tobacco and drug use. Limiting alcohol use. Practicing safe sex. Taking low-dose aspirin every day. Taking vitamin and mineral  supplements as recommended by your health care provider. What happens during an annual well check? The services and screenings done by your health care provider during your annual well check will depend on your age, overall health, lifestyle risk factors, and family history of disease. Counseling  Your health care provider may ask you questions about your: Alcohol use. Tobacco use. Drug use. Emotional well-being. Home and relationship well-being. Sexual activity. Eating habits. History of falls. Memory and ability to understand (cognition). Work and work Statistician. Reproductive health. Screening  You may have the following tests or measurements: Height, weight, and BMI. Blood pressure. Lipid and cholesterol levels. These may be checked every 5 years, or more frequently if you are over 9 years old. Skin check. Lung cancer screening. You may have this screening every year starting at age 68 if you have a 30-pack-year history of smoking and currently smoke or have quit within the past 15 years. Fecal occult blood test (FOBT) of the stool. You may have this test every year starting at age 38. Flexible sigmoidoscopy or colonoscopy. You may have a sigmoidoscopy every 5 years or a colonoscopy every 10 years starting at age 75. Hepatitis C blood test. Hepatitis B blood test. Sexually transmitted disease (STD) testing. Diabetes screening. This is done by checking your blood sugar (glucose) after you have not eaten for a while (fasting). You may have this done every 1-3 years. Bone density  scan. This is done to screen for osteoporosis. You may have this done starting at age 52. Mammogram. This may be done every 1-2 years. Talk to your health care provider about how often you should have regular mammograms. Talk with your health care provider about your test results, treatment options, and if necessary, the need for more tests. Vaccines  Your health care provider may recommend certain  vaccines, such as: Influenza vaccine. This is recommended every year. Tetanus, diphtheria, and acellular pertussis (Tdap, Td) vaccine. You may need a Td booster every 10 years. Zoster vaccine. You may need this after age 77. Pneumococcal 13-valent conjugate (PCV13) vaccine. One dose is recommended after age 16. Pneumococcal polysaccharide (PPSV23) vaccine. One dose is recommended after age 71. Talk to your health care provider about which screenings and vaccines you need and how often you need them. This information is not intended to replace advice given to you by your health care provider. Make sure you discuss any questions you have with your health care provider. Document Released: 05/06/2015 Document Revised: 12/28/2015 Document Reviewed: 02/08/2015 Elsevier Interactive Patient Education  2017 [place] Prevention in the Home Falls can cause injuries. They can happen to people of all ages. There are many things you can do to make your home safe and to help prevent falls. What can I do on the outside of my home? Regularly fix the edges of walkways and driveways and fix any cracks. Remove anything that might make you trip as you walk through a door, such as a raised step or threshold. Trim any bushes or trees on the path to your home. Use bright outdoor lighting. Clear any walking paths of anything that might make someone trip, such as rocks or tools. Regularly check to see if handrails are loose or broken. Make sure that both sides of any steps have handrails. Any raised decks and porches should have guardrails on the edges. Have any leaves, snow, or ice cleared regularly. Use sand or salt on walking paths during winter. Clean up any spills in your garage right away. This includes oil or grease spills. What can I do in the bathroom? Use night lights. Install grab bars by the toilet and in the tub and shower. Do not use towel bars as grab bars. Use non-skid mats or decals in  the tub or shower. If you need to sit down in the shower, use a plastic, non-slip stool. Keep the floor dry. Clean up any water that spills on the floor as soon as it happens. Remove soap buildup in the tub or shower regularly. Attach bath mats securely with double-sided non-slip rug tape. Do not have throw rugs and other things on the floor that can make you trip. What can I do in the bedroom? Use night lights. Make sure that you have a light by your bed that is easy to reach. Do not use any sheets or blankets that are too big for your bed. They should not hang down onto the floor. Have a firm chair that has side arms. You can use this for support while you get dressed. Do not have throw rugs and other things on the floor that can make you trip. What can I do in the kitchen? Clean up any spills right away. Avoid walking on wet floors. Keep items that you use a lot in easy-to-reach places. If you need to reach something above you, use a strong step stool that has a grab bar. Keep electrical  cords out of the way. Do not use floor polish or wax that makes floors slippery. If you must use wax, use non-skid floor wax. Do not have throw rugs and other things on the floor that can make you trip. What can I do with my stairs? Do not leave any items on the stairs. Make sure that there are handrails on both sides of the stairs and use them. Fix handrails that are broken or loose. Make sure that handrails are as long as the stairways. Check any carpeting to make sure that it is firmly attached to the stairs. Fix any carpet that is loose or worn. Avoid having throw rugs at the top or bottom of the stairs. If you do have throw rugs, attach them to the floor with carpet tape. Make sure that you have a light switch at the top of the stairs and the bottom of the stairs. If you do not have them, ask someone to add them for you. What else can I do to help prevent falls? Wear shoes that: Do not have high  heels. Have rubber bottoms. Are comfortable and fit you well. Are closed at the toe. Do not wear sandals. If you use a stepladder: Make sure that it is fully opened. Do not climb a closed stepladder. Make sure that both sides of the stepladder are locked into place. Ask someone to hold it for you, if possible. Clearly mark and make sure that you can see: Any grab bars or handrails. First and last steps. Where the edge of each step is. Use tools that help you move around (mobility aids) if they are needed. These include: Canes. Walkers. Scooters. Crutches. Turn on the lights when you go into a dark area. Replace any light bulbs as soon as they burn out. Set up your furniture so you have a clear path. Avoid moving your furniture around. If any of your floors are uneven, fix them. If there are any pets around you, be aware of where they are. Review your medicines with your doctor. Some medicines can make you feel dizzy. This can increase your chance of falling. Ask your doctor what other things that you can do to help prevent falls. This information is not intended to replace advice given to you by your health care provider. Make sure you discuss any questions you have with your health care provider. Document Released: 02/03/2009 Document Revised: 09/15/2015 Document Reviewed: 05/14/2014 Elsevier Interactive Patient Education  2017 [employer].

## 2022-08-27 NOTE — Progress Notes (Unsigned)
   Acute Office Visit  Subjective:     Patient ID: Sherry Lowe, female    DOB: 1950-12-10, 72 y.o.   MRN: [id number]  No chief complaint on file.   HPI Patient is in today for leg pain.   ROS      Objective:    There were no vitals taken for this visit. {Vitals History (Optional):23777}  Physical Exam  No results found for any visits on 08/28/22.      Assessment & Plan:   Problem List Items Addressed This Visit   None   No orders of the defined types were placed in this encounter.   No follow-ups on file.  Margarita Mail, DO

## 2022-08-28 ENCOUNTER — Ambulatory Visit (INDEPENDENT_AMBULATORY_CARE_PROVIDER_SITE_OTHER): Payer: BC Managed Care – PPO | Admitting: Internal Medicine

## 2022-08-28 ENCOUNTER — Encounter: Payer: Self-pay | Admitting: Internal Medicine

## 2022-08-28 VITALS — BP 118/82 | HR 79 | Temp 98.2°F | Resp 18 | Ht 65.0 in | Wt 245.8 lb

## 2022-08-28 DIAGNOSIS — S76312A Strain of muscle, fascia and tendon of the posterior muscle group at thigh level, left thigh, initial encounter: Secondary | ICD-10-CM

## 2022-08-28 DIAGNOSIS — R635 Abnormal weight gain: Secondary | ICD-10-CM | POA: Diagnosis not present

## 2022-08-28 LAB — POCT GLYCOSYLATED HEMOGLOBIN (HGB A1C): Hemoglobin A1C: 5.4 % (ref 4.0–5.6)

## 2022-08-28 NOTE — Patient Instructions (Addendum)
Hamstring Strain  A hamstring strain happens when the muscles in the back of the thighs (hamstring muscles) are overstretched or torn. The hamstring muscles are used in straightening the hips, bending the knees, and pulling back the legs. This injury is often called a pulled hamstring muscle. The tissue that connects the muscle to a bone (tendon) may also be affected. The severity of a hamstring strain may be rated in degrees or grades. First-degree (or grade 1) strains have the least amount of muscle tearing and pain. Second-degree and third-degree (grade 2 and 3) strains have increasingly more tearing and pain. What are the causes? This condition is caused by a sudden, violent force being placed on the hamstring muscles, stretching them too far. This often happens during activities that involve sprinting, jumping, kicking, or weight lifting. What increases the risk? Hamstring strains are especially common in athletes. The following factors may also make you more likely to develop this condition: Having low strength, endurance, or flexibility of the hamstring muscles. Doing high-impact physical activity or sports. Having poor physical fitness. Having a previous leg injury. Having tired (fatigued) muscles. Having a previous hamstring strain. What are the signs or symptoms? Symptoms of this condition include: Pain in the back of the thigh or buttocks. Swelling. Bruising. Muscle spasms. Trouble moving the affected muscle because of pain. For severe strains, you may feel popping or snapping in the back of your thigh when the injury occurs. How is this diagnosed? This condition is diagnosed based on your symptoms, your medical history, and a physical exam. You may also have imaging tests, such as an MRI or X-rays. Your strain may be rated based on how severe it is. The ratings are: Grade 1 strain (mild). Muscles are overstretched. There may be very small muscle tears. Grade 2 strain  (moderate). Muscles are partially torn. Grade 3 strain (severe). Muscles are completely torn. How is this treated? Treatment for this condition usually involves: Protecting, resting, icing, applying compression, and elevating the injured area (PRICE therapy). Medicines. Your health care provider may recommend medicines to help reduce pain or inflammation. Doing exercises to regain strength and flexibility in the muscles. Your health care provider will tell you when it is okay to begin exercising. Hamstring strains may take a long time to heal. This type of strain may happen again in athletes. Follow your health care provider's advice about when to return to sports-related activities. Follow these instructions at home: PRICE therapy Use PRICE therapy to promote muscle healing during the first 2-3 days after your injury, or as told by your health care provider. Protect the muscle from being injured again. Rest your injury. This usually involves limiting your normal activities and not using the injured hamstring muscle. Talk with your health care provider about how you should limit your activities. Apply ice to the injured area: Put ice in a plastic bag. Place a towel between your skin and the bag. Leave the ice on for 20 minutes, 2-3 times a day. After the third day, switch to applying heat as told. Put pressure (compression) on your injured hamstring by wrapping it with an elastic bandage. Be careful not to wrap it too tightly. That may interfere with blood circulation or may increase swelling. Raise (elevate) your injured hamstring above the level of your heart as often as possible. When you are lying down, you can do this by putting a pillow under your thigh.  Activity Begin exercising or stretching only as told by your health care   provider. Do not return to full activity level until your health care provider approves. To help prevent muscle strains in the future, always warm up before  exercising and stretch afterward. General instructions Take over-the-counter and prescription medicines only as told by your health care provider. If directed, apply heat to the affected area as often as told by your health care provider. Use the heat source that your health care provider recommends, such as a moist heat pack or a heating pad. Place a towel between your skin and the heat source. Leave the heat on for 20-30 minutes. Remove the heat if your skin turns bright red. This is especially important if you are unable to feel pain, heat, or cold. You may have a greater risk of getting burned. Keep all follow-up visits. This is important. Contact a health care provider if: You have increasing pain or swelling in the injured area. You have numbness, tingling, or a significant loss of strength in the injured area. Get help right away if: Your foot or your toes become cold or turn blue. Summary A hamstring strain happens when the muscles in the back of the thighs (hamstring muscles) are overstretched or torn. This injury can be caused by a sudden, violent force being placed on the hamstring muscles, causing them to stretch too far. Symptoms include pain, swelling, and muscle spasms in the injured area. Treatment includes PRICE therapy: protecting, resting, icing, applying compression, and elevating the injured area. This information is not intended to replace advice given to you by your health care provider. Make sure you discuss any questions you have with your health care provider. Document Revised: 09/08/2020 Document Reviewed: 09/08/2020 [employer] Patient Education  2023 [employer]   Hamstring Strain Rehab Ask your health care provider which exercises are safe for you. Do exercises exactly as told by your health care provider and adjust them as directed. It is normal to feel mild stretching, pulling, tightness, or discomfort as you do these exercises. Stop right away if you feel  sudden pain or your pain gets worse. Do not begin these exercises until told by your health care provider. Stretching and range-of-motion exercises These exercises warm up your muscles and joints and improve the movement and flexibility of your thighs. These exercises also help to relieve pain, numbness, and tingling. Talk to your health care provider about these restrictions. Knee extension, seated  Sit with your left / right heel propped on a chair, a coffee table, or a footstool. Do not have anything under your knee to support it. Allow your leg muscles to relax, letting gravity straighten out your knee (extension). You should feel a stretch behind your left / right knee. If told by your health care provider, deepen the stretch by placing a __________ weight on your thigh, just above your kneecap. Hold this position for __________ seconds. Repeat __________ times. Complete this exercise __________ times a day. Seated stretch This exercise is sometimes called hamstrings and adductors stretch. Sit on the floor with your legs stretched wide. Keep your knees straight during this exercise. Keeping your head and back in a straight line, bend at your waist to reach for your left foot. You should feel a stretch in your right inner thigh (adductors). Hold this position for __________ seconds. Then slowly return to the upright position. Keeping your head and back in a straight line, bend at your waist to reach forward. You should feel a stretch behind both of your thighs or knees (hamstrings). Hold this   position for __________ seconds. Then slowly return to the upright position. Keeping your head and back in a straight line, bend at your waist to reach for your right foot. You should feel a stretch in your left inner thigh (adductors). Hold this position for __________ seconds. Then slowly return to the upright position. Repeat __________ times. Complete this exercise __________ times a day. Hamstrings  stretch, supine  Lie on your back (supine position). Loop a belt or towel over the ball of your left / right foot. The ball of your foot is on the walking surface, right under your toes. Straighten your left / right knee and slowly pull on the belt or towel to raise your leg. Do not let your left / right knee bend while you do this. Keep your other leg flat on the floor. Raise the left / right leg until you feel a gentle stretch behind your left / right knee or thigh (hamstrings). Hold this position for __________ seconds. Slowly return your leg to the starting position. Repeat __________ times. Complete this exercise __________ times a day. Strengthening exercises These exercises build strength and endurance in your thighs. Endurance is the ability to use your muscles for a long time, even after they get tired. Straight leg raises, prone This exercise strengthens the muscles that move the hips (hip extensors). Lie on your abdomen on a firm surface (prone position). Tense the muscles in your buttocks and lift your left / right leg about 4 inches (10 cm). Keep your knee straight as you lift your leg. If you cannot lift your leg that high without arching your back, place a pillow under your hips. Hold the position for __________ seconds. Slowly lower your leg to the starting position. Allow your muscles to relax completely before you start the next repetition. Repeat __________ times. Complete this exercise __________ times a day. Bridge This exercise strengthens the muscles in your buttocks and the back of your thighs (hip extensors). Lie on your back on a firm surface with your knees bent and your feet flat on the floor. Tighten your buttocks muscles and lift your bottom off the floor until the trunk of your body is level with your thighs. You should feel the muscles working in your buttocks and the back of your thighs. Do not arch your back. Hold this position for __________  seconds. Slowly lower your hips to the starting position. Let your buttocks muscles relax completely between repetitions. If told by your health care provider, keep your bottom lifted off the floor while you slowly walk your feet away from you as far as you can control. Hold for __________ seconds, then slowly walk your feet back toward you. Repeat __________ times. Complete this exercise __________ times a day. Lateral walking with band This is an exercise in which you walk sideways (lateral), with tension provided by an exercise band. The exercise strengthens the muscles in your hip (hip abductors). Stand in a long hallway. Wrap a loop of exercise band around your legs, just above your knees. Bend your knees gently and drop your hips down and back so your weight is over your heels. Step to the side to move down the length of the hallway, keeping your toes pointed ahead of you and keeping tension in the band. Repeat, leading with your other leg. Repeat __________ times. Complete this exercise __________ times a day. Single leg stand with reaching This exercise is also called eccentric hamstring stretch. Stand on your left / right   foot. Keep your big toe down on the floor and try to keep your arch lifted. Slowly reach down toward the floor as far as you can while keeping your balance. Lowering your thigh under tension is called eccentric stretching. Hold this position for __________ seconds. Repeat __________ times. Complete this exercise __________ times a day. Plank, prone This exercise strengthens muscles in your abdomen and core area. Lie on your abdomen on the floor (prone position),and prop yourself up on your elbows. Your hands should be straight out in front of you, and your elbows should be below your shoulders. Position your feet similar to a push-up position so your toes are on the ground. Tighten your abdominal muscles and lift your body off the floor. Do not arch your back. Do  not hold your breath. Hold this position for __________ seconds. Repeat __________ times. Complete this exercise __________ times a day. This information is not intended to replace advice given to you by your health care provider. Make sure you discuss any questions you have with your health care provider. Document Revised: 10/03/2020 Document Reviewed: 10/03/2020 Else[employer] Patient Education  2023 [employer]  

## 2022-09-20 DIAGNOSIS — M1711 Unilateral primary osteoarthritis, right knee: Secondary | ICD-10-CM | POA: Diagnosis not present

## 2022-09-25 ENCOUNTER — Ambulatory Visit (INDEPENDENT_AMBULATORY_CARE_PROVIDER_SITE_OTHER): Payer: BC Managed Care – PPO | Admitting: Internal Medicine

## 2022-09-25 ENCOUNTER — Encounter: Payer: Self-pay | Admitting: Internal Medicine

## 2022-09-25 VITALS — BP 120/84 | HR 86 | Temp 98.0°F | Resp 18 | Ht 65.0 in | Wt 242.7 lb

## 2022-09-25 DIAGNOSIS — J069 Acute upper respiratory infection, unspecified: Secondary | ICD-10-CM | POA: Diagnosis not present

## 2022-09-25 DIAGNOSIS — R051 Acute cough: Secondary | ICD-10-CM

## 2022-09-25 NOTE — Progress Notes (Signed)
   Acute Office Visit  Subjective:     Patient ID: Sherry Lowe, female    DOB: 06-30-1950, 72 y.o.   MRN: [id number]  Chief Complaint  Patient presents with   Cough    Deep cough with chest soreness and wheezing since friday    HPI Patient is in today for cough. Symptoms started about 5 days ago, deep cough started 3 days ago but since yesterday she has improved greatly.   URI Compliant:  -Fever: no -Cough: yes, productive clear/yellow -Shortness of breath: yes, after coughing fits -Wheezing: yes -Chest congestion: yes -Nasal congestion: yes -Runny nose: no -Post nasal drip: no -Sore throat: no -Sinus pressure: no -Headache: no -Face pain: no -Ear pain: no  -Ear pressure: yes left -Context: better -Treatments attempted: cold/sinus    Review of Systems  Constitutional:  Negative for chills and fever.  HENT:  Positive for congestion. Negative for ear pain and sore throat.   Respiratory:  Positive for cough and sputum production. Negative for shortness of breath and wheezing.   Cardiovascular:  Negative for chest pain.        Objective:    BP 120/84   Pulse 86   Temp 98 F (36.7 C)   Resp 18   Ht 5\' 5"  (1.651 m)   Wt 242 lb 11.2 oz (110.1 kg)   SpO2 98%   BMI 40.39 kg/m  BP Readings from Last 3 Encounters:  09/25/22 120/84  08/28/22 118/82  01/24/22 124/74   Wt Readings from Last 3 Encounters:  09/25/22 242 lb 11.2 oz (110.1 kg)  08/28/22 245 lb 12.8 oz (111.5 kg)  07/19/22 242 lb (109.8 kg)      Physical Exam Constitutional:      Appearance: Normal appearance.  HENT:     Head: Normocephalic and atraumatic.     Right Ear: Tympanic membrane, ear canal and external ear normal.     Left Ear: Tympanic membrane, ear canal and external ear normal.     Nose: Nose normal.     Mouth/Throat:     Mouth: Mucous membranes are moist.     Pharynx: Oropharynx is clear.  Eyes:     Conjunctiva/sclera: Conjunctivae normal.  Cardiovascular:     Rate and  Rhythm: Normal rate and regular rhythm.  Pulmonary:     Effort: Pulmonary effort is normal.     Breath sounds: Wheezing present.     Comments: Mild inspiratory wheezing that cleared with coughing Skin:    General: Skin is warm and dry.  Neurological:     General: No focal deficit present.     Mental Status: She is alert. Mental status is at baseline.  Psychiatric:        Mood and Affect: Mood normal.        Behavior: Behavior normal.     No results found for any visits on 09/25/22.      Assessment & Plan:   1. Upper respiratory tract infection, unspecified type/Acute cough: Mild wheezing on exam, otherwise physical exam benign. Offered to treat with cough suppressants and steroids, patient would rather take Mucinex OTC and let me know if her symptoms worsen.    Return if symptoms worsen or fail to improve.  Margarita Mail, DO

## 2023-01-05 IMAGING — MG MM DIGITAL SCREENING BILAT W/ TOMO AND CAD
8 series · 8 of 24 positions shown · non-contrast
Comparison: Previous exam(s).

CLINICAL DATA: Screening.

EXAM:
DIGITAL SCREENING BILATERAL MAMMOGRAM WITH TOMOSYNTHESIS AND CAD
TECHNIQUE: Bilateral screening digital craniocaudal and mediolateral oblique
mammograms were obtained. Bilateral screening digital breast
tomosynthesis was performed. The images were evaluated with
computer-aided detection.

[R MLO synth-2D]
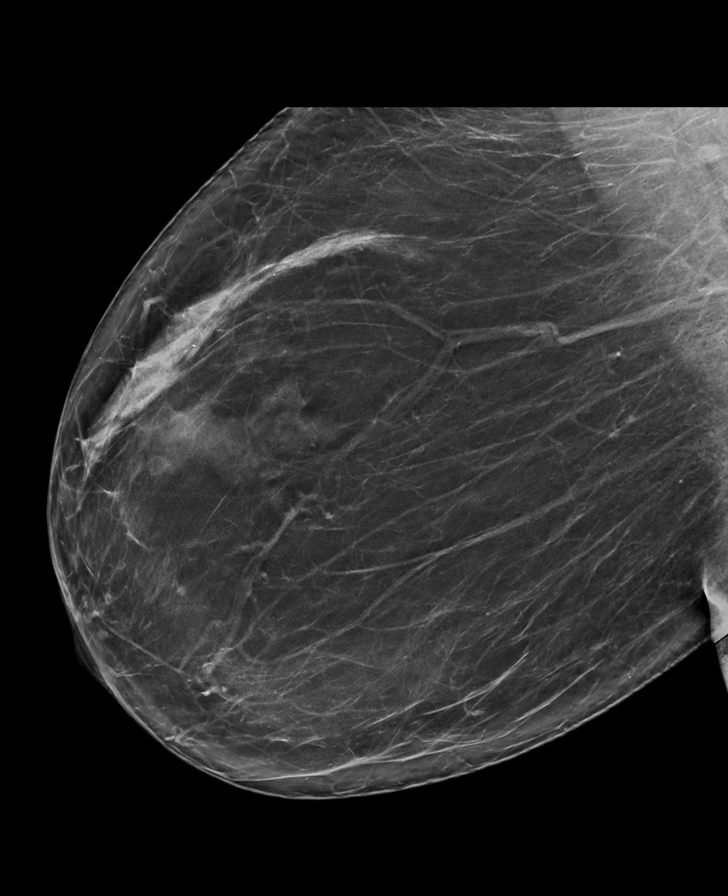

[L MLO synth-2D]
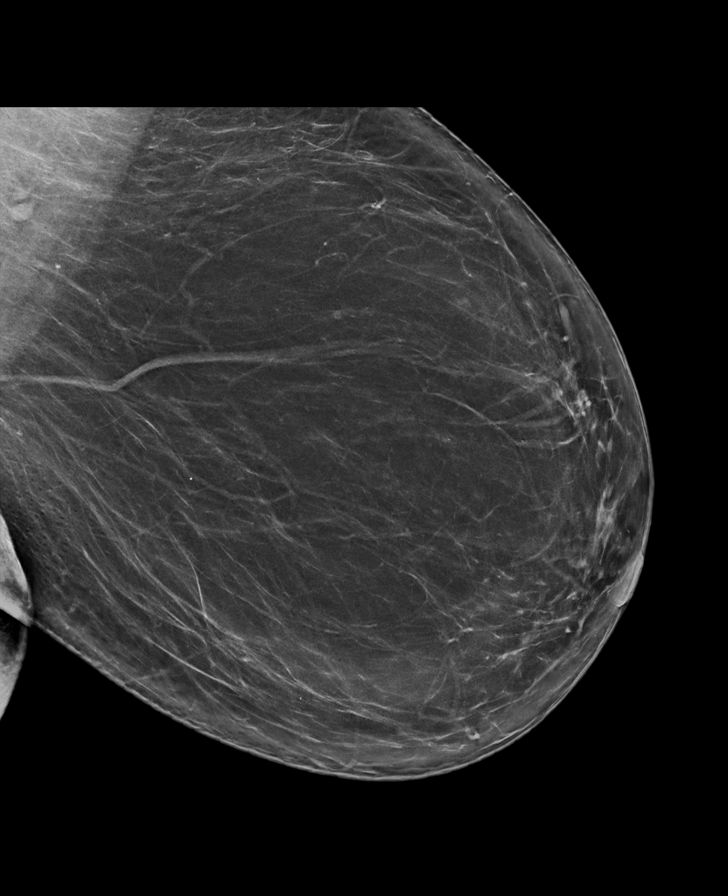

[R CC synth-2D]
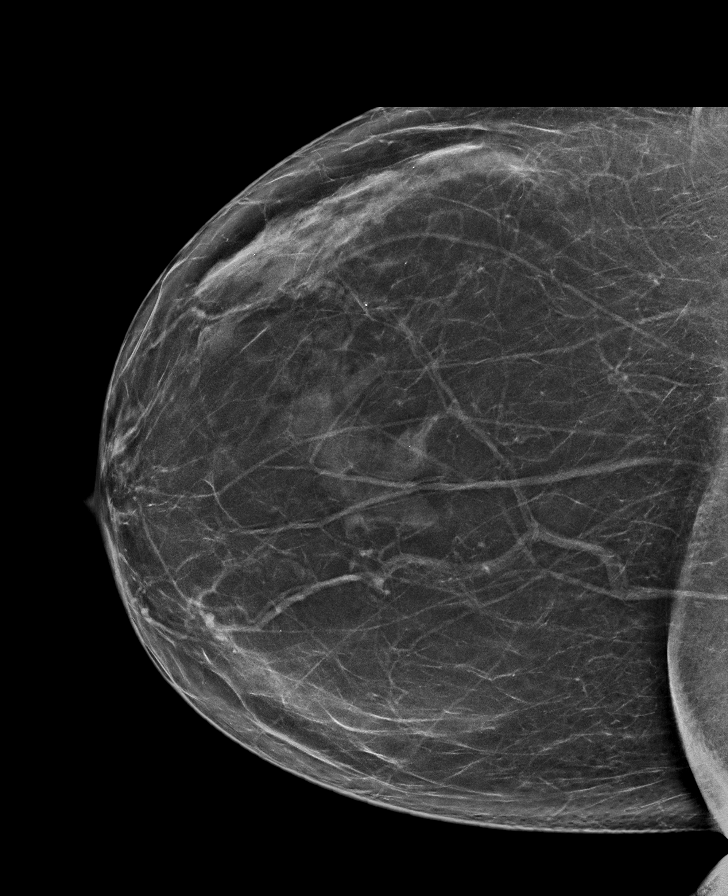

[L CC synth-2D]
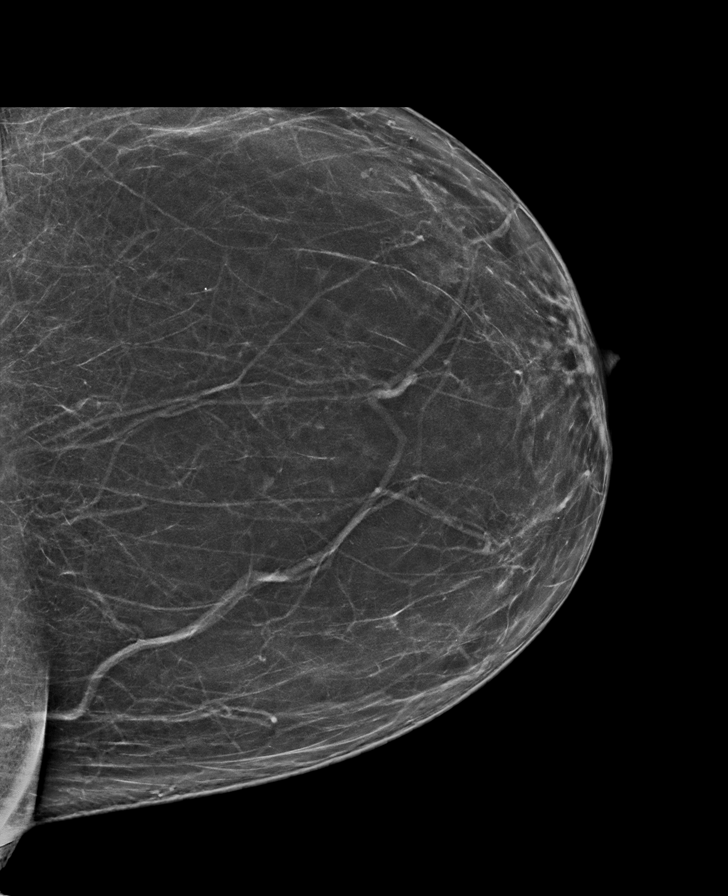

[R MLO tomo · tomo slice 46/91.0]
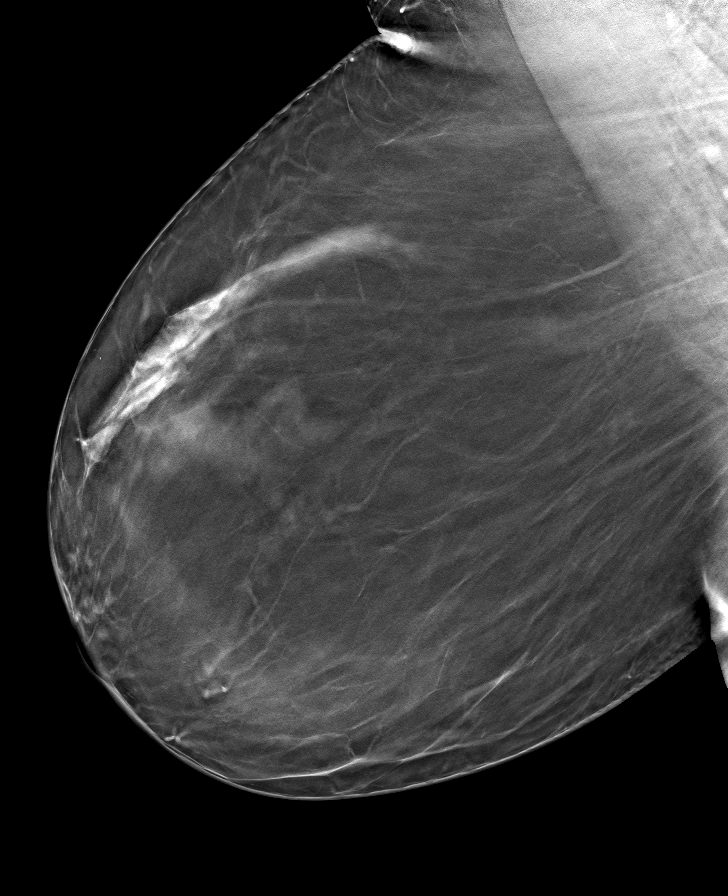

[L CC tomo · tomo slice 41/82.0]
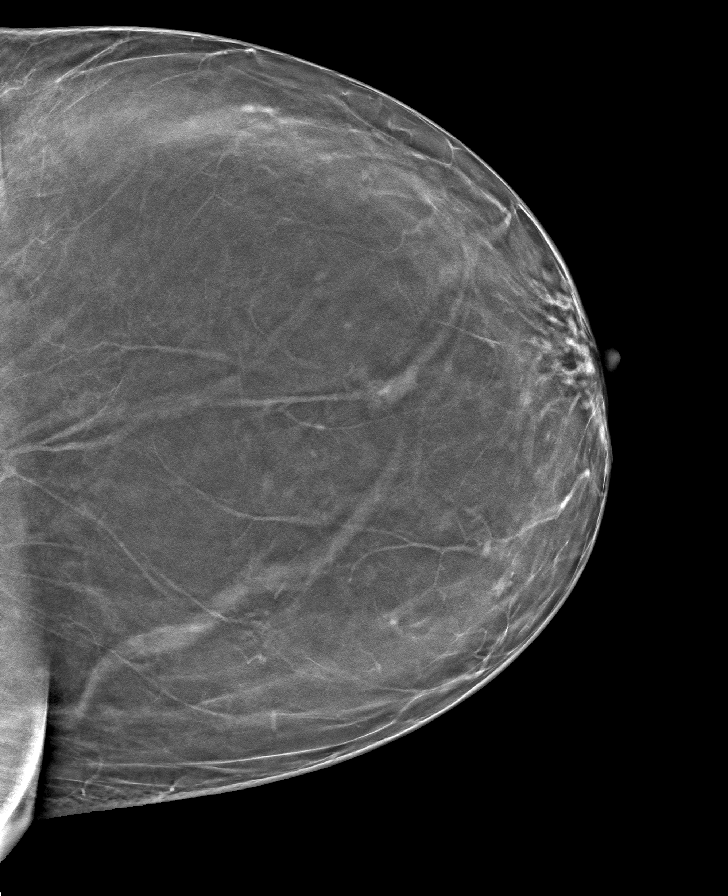

[R CC tomo · tomo slice 43/84.0]
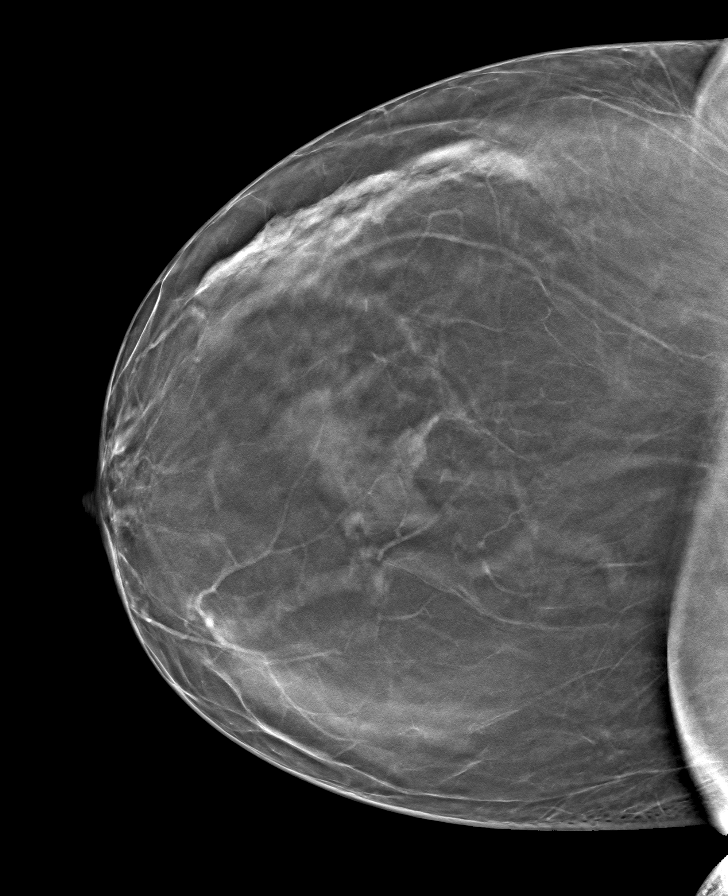

[L MLO tomo · tomo slice 47/92.0]
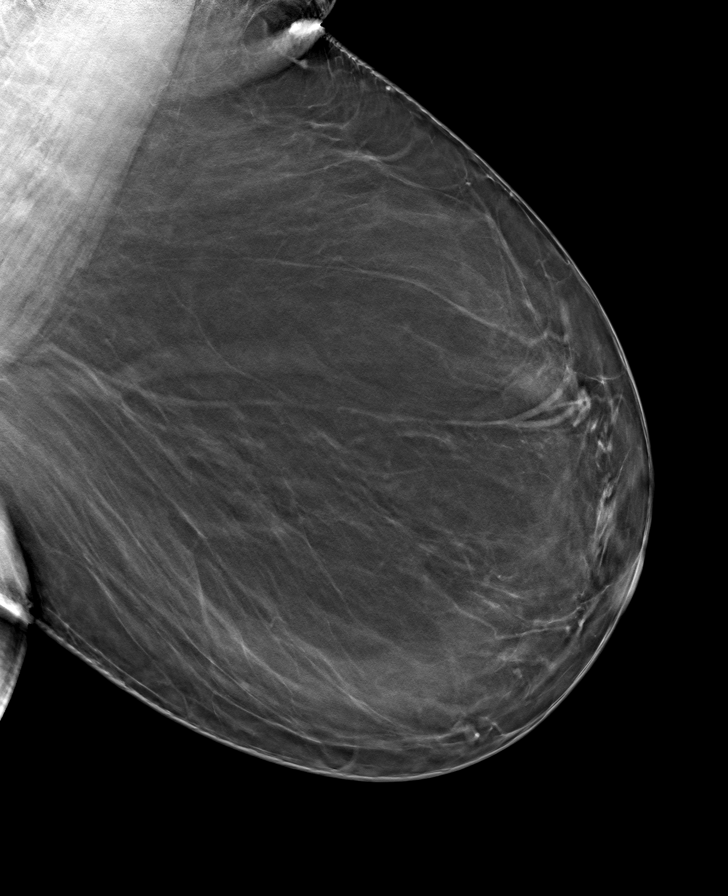

[8 of 24 positions shown; findings below may reference images not displayed]

ACR Breast Density Category b: There are scattered areas of
fibroglandular density.
FINDINGS: There are no findings suspicious for malignancy.
IMPRESSION: No mammographic evidence of malignancy. A result letter of this
screening mammogram will be mailed directly to the patient.

RECOMMENDATION:
Screening mammogram in one year. (Code:51-O-LD2)

BI-RADS CATEGORY  1: Negative.

## 2023-01-29 DIAGNOSIS — I10 Essential (primary) hypertension: Secondary | ICD-10-CM | POA: Diagnosis not present

## 2023-01-29 DIAGNOSIS — R55 Syncope and collapse: Secondary | ICD-10-CM | POA: Diagnosis not present

## 2023-01-29 DIAGNOSIS — R110 Nausea: Secondary | ICD-10-CM | POA: Diagnosis not present

## 2023-01-29 DIAGNOSIS — R42 Dizziness and giddiness: Secondary | ICD-10-CM | POA: Diagnosis not present

## 2023-01-29 DIAGNOSIS — R531 Weakness: Secondary | ICD-10-CM | POA: Diagnosis not present

## 2023-01-30 ENCOUNTER — Emergency Department: Payer: BC Managed Care – PPO

## 2023-01-30 ENCOUNTER — Encounter (HOSPITAL_COMMUNITY): Payer: Self-pay

## 2023-01-30 ENCOUNTER — Other Ambulatory Visit: Payer: Self-pay

## 2023-01-30 ENCOUNTER — Observation Stay
Admission: EM | Admit: 2023-01-30 | Discharge: 2023-02-01 | Disposition: A | Discharge status: Home / Self Care | Payer: BC Managed Care – PPO | Attending: Student | Admitting: Student

## 2023-01-30 ENCOUNTER — Encounter: Payer: Self-pay | Admitting: Emergency Medicine

## 2023-01-30 ENCOUNTER — Emergency Department (HOSPITAL_COMMUNITY)
Admission: EM | Admit: 2023-01-30 | Discharge: 2023-01-30 | Discharge status: Against Medical Advice | Payer: BC Managed Care – PPO | Attending: Emergency Medicine | Admitting: Emergency Medicine

## 2023-01-30 DIAGNOSIS — I951 Orthostatic hypotension: Secondary | ICD-10-CM | POA: Insufficient documentation

## 2023-01-30 DIAGNOSIS — R109 Unspecified abdominal pain: Secondary | ICD-10-CM | POA: Diagnosis not present

## 2023-01-30 DIAGNOSIS — R112 Nausea with vomiting, unspecified: Secondary | ICD-10-CM | POA: Diagnosis not present

## 2023-01-30 DIAGNOSIS — Z5321 Procedure and treatment not carried out due to patient leaving prior to being seen by health care provider: Secondary | ICD-10-CM | POA: Diagnosis not present

## 2023-01-30 DIAGNOSIS — K5730 Diverticulosis of large intestine without perforation or abscess without bleeding: Secondary | ICD-10-CM | POA: Diagnosis not present

## 2023-01-30 DIAGNOSIS — Z6839 Body mass index (BMI) 39.0-39.9, adult: Secondary | ICD-10-CM | POA: Diagnosis not present

## 2023-01-30 DIAGNOSIS — E669 Obesity, unspecified: Secondary | ICD-10-CM | POA: Diagnosis present

## 2023-01-30 DIAGNOSIS — E860 Dehydration: Secondary | ICD-10-CM | POA: Diagnosis not present

## 2023-01-30 DIAGNOSIS — R197 Diarrhea, unspecified: Secondary | ICD-10-CM | POA: Insufficient documentation

## 2023-01-30 DIAGNOSIS — K529 Noninfective gastroenteritis and colitis, unspecified: Secondary | ICD-10-CM | POA: Diagnosis not present

## 2023-01-30 DIAGNOSIS — I10 Essential (primary) hypertension: Secondary | ICD-10-CM | POA: Insufficient documentation

## 2023-01-30 DIAGNOSIS — R509 Fever, unspecified: Secondary | ICD-10-CM | POA: Insufficient documentation

## 2023-01-30 DIAGNOSIS — R42 Dizziness and giddiness: Secondary | ICD-10-CM | POA: Diagnosis not present

## 2023-01-30 DIAGNOSIS — R55 Syncope and collapse: Secondary | ICD-10-CM | POA: Diagnosis not present

## 2023-01-30 DIAGNOSIS — R110 Nausea: Secondary | ICD-10-CM | POA: Diagnosis not present

## 2023-01-30 DIAGNOSIS — N281 Cyst of kidney, acquired: Secondary | ICD-10-CM | POA: Diagnosis not present

## 2023-01-30 LAB — CBC
HCT: 41.5 % (ref 36.0–46.0)
Hemoglobin: 13.4 g/dL (ref 12.0–15.0)
MCH: 27.9 pg (ref 26.0–34.0)
MCHC: 32.3 g/dL (ref 30.0–36.0)
MCV: 86.5 fL (ref 80.0–100.0)
Platelets: 178 10*3/uL (ref 150–400)
RBC: 4.8 MIL/uL (ref 3.87–5.11)
RDW: 13.5 % (ref 11.5–15.5)
WBC: 6 10*3/uL (ref 4.0–10.5)
nRBC: 0 % (ref 0.0–0.2)

## 2023-01-30 LAB — BASIC METABOLIC PANEL
Anion gap: 15 (ref 5–15)
BUN: 14 mg/dL (ref 8–23)
CO2: 21 mmol/L — ABNORMAL LOW (ref 22–32)
Calcium: 10 mg/dL (ref 8.9–10.3)
Chloride: 103 mmol/L (ref 98–111)
Creatinine, Ser: 1.06 mg/dL — ABNORMAL HIGH (ref 0.44–1.00)
GFR, Estimated: 56 mL/min — ABNORMAL LOW (ref >60)
Glucose, Bld: 132 mg/dL — ABNORMAL HIGH (ref 70–99)
Potassium: 4.2 mmol/L (ref 3.5–5.1)
Sodium: 139 mmol/L (ref 135–145)

## 2023-01-30 LAB — URINALYSIS, ROUTINE W REFLEX MICROSCOPIC
Bilirubin Urine: NEGATIVE
Glucose, UA: NEGATIVE mg/dL
Hgb urine dipstick: NEGATIVE
Ketones, ur: 5 mg/dL — AB
Leukocytes,Ua: NEGATIVE
Nitrite: NEGATIVE
Protein, ur: NEGATIVE mg/dL
Specific Gravity, Urine: 1.006 (ref 1.005–1.030)
pH: 8 (ref 5.0–8.0)

## 2023-01-30 MED ORDER — MELATONIN 5 MG PO TABS
5.0000 mg | ORAL_TABLET | Freq: Once | ORAL | Status: AC
  Administered 2023-01-30: 5 mg via ORAL
  Filled 2023-01-30: qty 1

## 2023-01-30 MED ORDER — HYDRALAZINE HCL 20 MG/ML IJ SOLN
10.0000 mg | INTRAMUSCULAR | Status: DC | PRN
  Administered 2023-01-30: 10 mg via INTRAVENOUS
  Filled 2023-01-30: qty 1

## 2023-01-30 MED ORDER — SODIUM CHLORIDE 0.9 % IV SOLN: INTRAVENOUS | Status: AC

## 2023-01-30 MED ORDER — ONDANSETRON HCL 4 MG PO TABS: 4.0000 mg | ORAL_TABLET | Freq: Four times a day (QID) | ORAL | Status: DC | PRN

## 2023-01-30 MED ORDER — IOHEXOL 350 MG/ML SOLN
100.0000 mL | Freq: Once | INTRAVENOUS | Status: AC | PRN
  Administered 2023-01-30: 100 mL via INTRAVENOUS

## 2023-01-30 MED ORDER — ONDANSETRON HCL 4 MG/2ML IJ SOLN
4.0000 mg | Freq: Four times a day (QID) | INTRAMUSCULAR | Status: DC | PRN
  Administered 2023-01-30 – 2023-01-31 (×3): 4 mg via INTRAVENOUS
  Filled 2023-01-30 (×3): qty 2

## 2023-01-30 MED ORDER — ENOXAPARIN SODIUM 60 MG/0.6ML IJ SOSY
0.5000 mg/kg | PREFILLED_SYRINGE | INTRAMUSCULAR | Status: DC
  Administered 2023-01-30 – 2023-02-01 (×3): 55 mg via SUBCUTANEOUS
  Filled 2023-01-30 (×3): qty 0.6

## 2023-01-30 MED ORDER — LACTATED RINGERS IV BOLUS
1000.0000 mL | Freq: Once | INTRAVENOUS | Status: AC
  Administered 2023-01-30: 1000 mL via INTRAVENOUS

## 2023-01-30 MED ORDER — METOCLOPRAMIDE HCL 5 MG/ML IJ SOLN
10.0000 mg | Freq: Once | INTRAMUSCULAR | Status: AC
  Administered 2023-01-30: 10 mg via INTRAVENOUS
  Filled 2023-01-30: qty 2

## 2023-01-30 MED ORDER — METOCLOPRAMIDE HCL 5 MG/ML IJ SOLN
5.0000 mg | Freq: Once | INTRAMUSCULAR | Status: AC
  Administered 2023-01-30: 5 mg via INTRAVENOUS
  Filled 2023-01-30: qty 2

## 2023-01-30 MED ORDER — ONDANSETRON HCL 4 MG/2ML IJ SOLN
4.0000 mg | Freq: Once | INTRAMUSCULAR | Status: AC
  Administered 2023-01-30: 4 mg via INTRAVENOUS
  Filled 2023-01-30: qty 2

## 2023-01-30 MED ORDER — ONDANSETRON 4 MG PO TBDP: 8.0000 mg | ORAL_TABLET | Freq: Once | ORAL | Status: DC

## 2023-01-30 NOTE — ED Triage Notes (Signed)
Patient Sherry Lowe from home with complaint of dizziness starting at around 1730 tonight. At 2030 patient reports going to bathroom had a BM and becoming increasingly more lightheaded, unable to stand, and nauseated.  EMS reports that patient was orthostatic and hypotensive with nausea, diaphoretic, clammy and pale.

## 2023-01-30 NOTE — ED Notes (Signed)
The pt was able to ambulate to the toilet with assistance. The pt was then able to ambulate back to the bed without incident.

## 2023-01-30 NOTE — Assessment & Plan Note (Signed)
Intractable nausea vomiting 1 episode of diarrhea over the past 24 hours with noted mild colitis on imaging White count within normal limits Afebrile Will otherwise monitor for now with symptomatic management GI panel ordered Reassess if there is any clinical decline

## 2023-01-30 NOTE — Plan of Care (Signed)
  Problem: Clinical Measurements: Goal: Will remain free from infection Outcome: Progressing   Problem: Clinical Measurements: Goal: Ability to maintain clinical measurements within normal limits will improve Outcome: Progressing   Problem: Clinical Measurements: Goal: Diagnostic test results will improve Outcome: Progressing   Problem: Clinical Measurements: Goal: Respiratory complications will improve Outcome: Progressing   Problem: Clinical Measurements: Goal: Cardiovascular complication will be avoided Outcome: Progressing   Problem: Activity: Goal: Risk for activity intolerance will decrease Outcome: Progressing   Problem: Elimination: Goal: Will not experience complications related to bowel motility Outcome: Progressing

## 2023-01-30 NOTE — Assessment & Plan Note (Signed)
BMI 39 

## 2023-01-30 NOTE — Assessment & Plan Note (Signed)
blood pressures 150s to 200s over 80s to 110s Titrate BP regimen  Follow

## 2023-01-30 NOTE — ED Provider Notes (Signed)
[facility] Provider Note    Event Date/Time   First MD Initiated Contact with Patient 01/30/23 0246     (approximate)   History   Nausea and Dizziness   HPI  Sherry Lowe is a 72 y.o. female who presents to the ED for evaluation of Nausea and Dizziness   Review of PCP visit from June, seen for URI. Also review documentation and blood/urine tests from [facility] ED 2 hours ago.  She checked into their ER and was seen by the provider in triage, had blood and urine test but left without full visit.  Urine with small ketones but no infectious features, normal CBC, metabolic panel with bicarb of 21 but normal electrolytes.   Patient presents to the ED for evaluation of of nausea, emesis and diarrhea just in the past few hours.  She has been at her baseline without recent illnesses, until about 5 or 6 PM this afternoon she developed nausea.  She had 1 large watery bowel movement and 2 subsequent episodes of nonbloody nonbilious emesis.  After this, she reports persistent presyncopal dizziness, nausea and vague periumbilical discomfort.  No fevers, syncope or falls, urinary changes, chest pain   Physical Exam   Triage Vital Signs: ED Triage Vitals  Encounter Vitals Group     BP 01/30/23 0225 (!) 210/112     Systolic BP Percentile --      Diastolic BP Percentile --      Pulse Rate 01/30/23 0222 86     Resp 01/30/23 0222 18     Temp 01/30/23 0222 98.2 F (36.8 C)     Temp Source 01/30/23 0222 Oral     SpO2 01/30/23 0222 98 %     Weight 01/30/23 0224 242 lb (109.8 kg)     Height --      Head Circumference --      Peak Flow --      Pain Score 01/30/23 0224 0     Pain Loc --      Pain Education --      Exclude from Growth Chart --     Most recent vital signs: Vitals:   01/30/23 0530 01/30/23 0600  BP: (!) 155/84 (!) 151/90  Pulse: 78 83  Resp:  17  Temp:  98.3 F (36.8 C)  SpO2: 99% 97%    General: Awake, no distress.  Wet washcloth on  her forehead, pleasant and conversational CV:  Good peripheral perfusion.  Resp:  Normal effort.  Abd:  No distention.  Minimal periumbilical tenderness without peritoneal features.  Abdomen is benign otherwise. MSK:  No deformity noted.  Neuro:  No focal deficits appreciated. Other:     ED Results / Procedures / Treatments   Labs (all labs ordered are listed, but only abnormal results are displayed) Labs Reviewed - No data to display  EKG   RADIOLOGY CT abdomen/pelvis interpreted by me without evidence of SBO  Official radiology report(s): CT ABDOMEN PELVIS W CONTRAST  Result Date: 01/30/2023 CLINICAL DATA:  Abdominal pain, nausea vomiting and diarrhea. EXAM: CT ABDOMEN AND PELVIS WITH CONTRAST TECHNIQUE: Multidetector CT imaging of the abdomen and pelvis was performed using the standard protocol following bolus administration of intravenous contrast. RADIATION DOSE REDUCTION: This exam was performed according to the departmental dose-optimization program which includes automated exposure control, adjustment of the mA and/or kV according to patient size and/or use of iterative reconstruction technique. CONTRAST:  OMNIPAQUE IOHEXOL 350 MG/ML SOLN COMPARISON:  None Available. FINDINGS: Lower chest: The visualized lung bases are clear. No intra-abdominal free air or free fluid. Hepatobiliary: The liver is unremarkable. No biliary ductal dilatation. The gallbladder is unremarkable Pancreas: Unremarkable. No pancreatic ductal dilatation or surrounding inflammatory changes. Spleen: Normal in size without focal abnormality. Adrenals/Urinary Tract: The adrenal glands are unremarkable. There is no hydronephrosis on either side. There is symmetric enhancement and excretion of contrast by both kidneys. Small left renal parapelvic cysts. The visualized ureters and urinary bladder appear unremarkable. Stomach/Bowel: Mild sigmoid diverticulosis without active inflammatory changes. There is thickened  appearance of the majority of the colon, likely related to underdistention. Colitis is less likely. Clinical correlation is recommended. There is no bowel obstruction. The appendix is normal. Vascular/Lymphatic: The abdominal aorta and IVC are unremarkable. No portal venous gas. There is no adenopathy. Reproductive: Hysterectomy.  No adnexal masses. Other: None Musculoskeletal: Lower lumbar facet arthropathy. No acute osseous pathology. IMPRESSION: 1. Underdistention of the colon versus less likely colitis. Clinical correlation is recommended. 2. Mild sigmoid diverticulosis. No bowel obstruction. Normal appendix. Electronically Signed   By: Elgie Collard M.D.   On: 01/30/2023 03:43    PROCEDURES and INTERVENTIONS:  Procedures  Medications  lactated ringers bolus 1,000 mL (0 mLs Intravenous Stopped 01/30/23 0410)  ondansetron (ZOFRAN) injection 4 mg (4 mg Intravenous Given 01/30/23 0306)  iohexol (OMNIPAQUE) 350 MG/ML injection 100 mL (100 mLs Intravenous Contrast Given 01/30/23 0324)  metoCLOPramide (REGLAN) injection 10 mg (10 mg Intravenous Given 01/30/23 0447)     IMPRESSION / MDM / ASSESSMENT AND PLAN / ED COURSE  I reviewed the triage vital signs and the nursing notes.  Differential diagnosis includes, but is not limited to, gastroenteritis, appendicitis, acute cystitis, dehydration or AKI  {Patient presents with symptoms of an acute illness or injury that is potentially life-threatening.  Patient presents with symptoms of orthostasis in the setting of continued N/V/D requiring medical observation admission.  Reassuring vital signs and exam.  Minimal tenderness without peritoneal features.  Positional dizziness but no signs of neurologic deficits or trauma.  Blood work with slightly decreased bicarbonate, ketones in the urine are suggestive of dehydration.  CT obtained with mild signs of colitis but no obstructive features.  Provided IV fluids, antiemetics but she has persistent symptoms so  we will consult medicine for admission  Clinical Course as of 01/30/23 0614  Wed Jan 30, 2023  0420 Reassessed. Reports persistent nausea, IV positional and fluids ongoing. Add reglan [DS]    Clinical Course User Index [DS] Delton Prairie, MD     FINAL CLINICAL IMPRESSION(S) / ED DIAGNOSES   Final diagnoses:  Nausea vomiting and diarrhea  Dehydration  Orthostasis     Rx / DC Orders   ED Discharge Orders     None        Note:  This document was prepared using Dragon voice recognition software and may include unintentional dictation errors.   Delton Prairie, MD 01/30/23 [phone]

## 2023-01-30 NOTE — ED Triage Notes (Signed)
Pt arrives after triage at [facility] 2hrs ago. Pt states she has had nausea and dizziness since 1730, had a near syncopal episode after BM at 8:30pm. Arrives having n/v, thinks she ate a bad salad yesterday

## 2023-01-30 NOTE — ED Notes (Signed)
The pt was lying supine in the bed with her head slightly elevated sleeping. The pt's family member is at bedside.

## 2023-01-30 NOTE — Progress Notes (Signed)
PHARMACIST - PHYSICIAN COMMUNICATION  CONCERNING:  Enoxaparin (Lovenox) for DVT Prophylaxis    RECOMMENDATION: Patient was prescribed enoxaprin 40mg  q24 hours for VTE prophylaxis.   Filed Weights   01/30/23 0224  Weight: 109.8 kg (242 lb)    Body mass index is 39.06 kg/m.  Estimated Creatinine Clearance: 60.2 mL/min (A) (by C-G formula based on SCr of 1.06 mg/dL (H)).   Based on [facility] policy patient is candidate for enoxaparin 0.5mg /kg TBW SQ every 24 hours based on BMI being >30.  DESCRIPTION: Pharmacy has adjusted enoxaparin dose per [facility] policy.  Patient is now receiving enoxaparin 55 mg every 24 hours    Rockwell Alexandria, PharmD Clinical Pharmacist  01/30/2023 8:58 AM

## 2023-01-30 NOTE — ED Provider Notes (Signed)
Emergency Medicine Provider Triage Evaluation Note  Sherry Lowe , a 72 y.o. female  was evaluated in triage.  Pt complains of not feeling well. Apparently is nauseous after a large non-diarrheal BM. Reportedly was orthostatic with EMS. No recent sick contacts, abnormal food intake. No fevers. Tried tums without relief  Review of Systems  Positive: above Negative: above  Physical Exam  BP (!) 166/87 (BP Location: Right Arm)   Pulse 68   Temp 98 F (36.7 C) (Oral)   Resp 16   Ht 5\' 6"  (1.676 m)   Wt 109.8 kg   SpO2 96%   BMI 39.06 kg/m  Gen:   Awake, no distress   Resp:  Normal effort  MSK:   Moves extremities without difficulty  Other:    Medical Decision Making  Medically screening exam initiated at 1:18 AM.  Appropriate orders placed.  Sherry Lowe was informed that the remainder of the evaluation will be completed by another provider, this initial triage assessment does not replace that evaluation, and the importance of remaining in the ED until their evaluation is complete.   Sherry Memos, MD 01/30/23 [phone]

## 2023-01-30 NOTE — H&P (Addendum)
History and Physical    Patient: Sherry Lowe:[id number] DOB: 1950-08-22 DOA: 01/30/2023 DOS: the patient was seen and examined on 01/30/2023 PCP: Margarita Mail, DO  Patient coming from: Home  Chief Complaint:  Chief Complaint  Patient presents with   Nausea   Dizziness   HPI: Sherry Lowe is a 72 y.o. female with medical history significant of obesity presenting with colitis.  Patient reports recurrent nausea and vomiting over the past 12-24 hours.  Patient reports being in otherwise normal state of health.  Had a salad yesterday with grilled chicken.  Patient states that was from a restaurant in the [place] area.  Had worsening nausea vomiting as well as abdominal pain from this point.  Emesis nonbloody nonbilious.  1 episode of diarrhea.  No chest pain or shortness of breath.  Has been unable to tolerate solid p.o. intake secondary to nausea.  No focal hemiparesis or confusion.  Patient states she does not take any medication on a regular basis.  No reported alcohol or tobacco use.  No reported sick contacts with similar symptoms. Presented to the ER afebrile, blood pressures 150s to 200s over 80s to 110s.  Satting well on room air.  White count 6, hemoglobin 13.4, platelets 178, creatinine 1.06, glucose 132.  CT imaging with possible colitis.  Noted sigmoid diverticulosis without diverticulitis mentioned. Review of Systems: As mentioned in the history of present illness. All other systems reviewed and are negative. Past Medical History:  Diagnosis Date   Arthritis    H/O bilateral oophorectomy 1985   along with hysterectomy   H/O cardiac catheterization 2005   reportedly normal per patient   History of bone density study    2012, 2017 normal   History of UTI    Obesity    S/P hysterectomy 1995   Seasonal allergies    Shingles 2014   Wears glasses    Past Surgical History:  Procedure Laterality Date   CARDIAC CATHETERIZATION  1995   reportedly normal per patient.      COLONOSCOPY  2013   normal per patient   VAGINAL HYSTERECTOMY  1985   total, due to fibroids   WISDOM TOOTH EXTRACTION     Social History:  reports that she has never smoked. She has never used smokeless tobacco. She reports that she does not drink alcohol and does not use drugs.  Allergies  Allergen Reactions   Alprazolam Hives   Sulfa Antibiotics Hives   Sulfa Drugs Cross Reactors Itching   Zithromax [Azithromycin]     Family History  Problem Relation Age of Onset   Hyperlipidemia Father        Living, 100   Cancer Father        prostate   Hypertension Father    Hyperlipidemia Mother        Living 57   Healthy Brother        x2   Cancer Maternal Grandmother        stomach   Stroke Maternal Grandfather     Prior to Admission medications   Not on File    Physical Exam: Vitals:   01/30/23 0314 01/30/23 0530 01/30/23 0600 01/30/23 0700  BP:  (!) 155/84 (!) 151/90 (!) 163/93  Pulse: 70 78 83 74  Resp:   17   Temp:   98.3 F (36.8 C)   TempSrc:      SpO2: 99% 99% 97% 99%  Weight:       Physical Exam Constitutional:  Appearance: She is obese.  HENT:     Head: Normocephalic and atraumatic.     Nose: Nose normal.     Mouth/Throat:     Mouth: Mucous membranes are moist.  Eyes:     Pupils: Pupils are equal, round, and reactive to light.  Cardiovascular:     Rate and Rhythm: Normal rate and regular rhythm.  Pulmonary:     Effort: Pulmonary effort is normal.  Abdominal:     General: Bowel sounds are normal.     Comments: Obese abdomen  Nontender    Musculoskeletal:        General: Normal range of motion.  Skin:    General: Skin is dry.  Neurological:     General: No focal deficit present.  Psychiatric:        Mood and Affect: Mood normal.     Data Reviewed:  There are no new results to review at this time.  CT ABDOMEN PELVIS W CONTRAST CLINICAL DATA:  Abdominal pain, nausea vomiting and diarrhea.  EXAM: CT ABDOMEN AND PELVIS WITH  CONTRAST  TECHNIQUE: Multidetector CT imaging of the abdomen and pelvis was performed using the standard protocol following bolus administration of intravenous contrast.  RADIATION DOSE REDUCTION: This exam was performed according to the departmental dose-optimization program which includes automated exposure control, adjustment of the mA and/or kV according to patient size and/or use of iterative reconstruction technique.  CONTRAST:  OMNIPAQUE IOHEXOL 350 MG/ML SOLN  COMPARISON:  None Available.  FINDINGS: Lower chest: The visualized lung bases are clear.  No intra-abdominal free air or free fluid.  Hepatobiliary: The liver is unremarkable. No biliary ductal dilatation. The gallbladder is unremarkable  Pancreas: Unremarkable. No pancreatic ductal dilatation or surrounding inflammatory changes.  Spleen: Normal in size without focal abnormality.  Adrenals/Urinary Tract: The adrenal glands are unremarkable. There is no hydronephrosis on either side. There is symmetric enhancement and excretion of contrast by both kidneys. Small left renal parapelvic cysts. The visualized ureters and urinary bladder appear unremarkable.  Stomach/Bowel: Mild sigmoid diverticulosis without active inflammatory changes. There is thickened appearance of the majority of the colon, likely related to underdistention. Colitis is less likely. Clinical correlation is recommended. There is no bowel obstruction. The appendix is normal.  Vascular/Lymphatic: The abdominal aorta and IVC are unremarkable. No portal venous gas. There is no adenopathy.  Reproductive: Hysterectomy.  No adnexal masses.  Other: None  Musculoskeletal: Lower lumbar facet arthropathy. No acute osseous pathology.  IMPRESSION: 1. Underdistention of the colon versus less likely colitis. Clinical correlation is recommended. 2. Mild sigmoid diverticulosis. No bowel obstruction. Normal appendix.  Electronically Signed    By: Elgie Collard M.D.   On: 01/30/2023 03:43  Lab Results  Component Value Date   WBC 6.0 01/30/2023   HGB 13.4 01/30/2023   HCT 41.5 01/30/2023   MCV 86.5 01/30/2023   PLT 178 01/30/2023   Last metabolic panel Lab Results  Component Value Date   GLUCOSE 132 (H) 01/30/2023   NA 139 01/30/2023   K 4.2 01/30/2023   CL 103 01/30/2023   CO2 21 (L) 01/30/2023   BUN 14 01/30/2023   CREATININE 1.06 (H) 01/30/2023   GFRNONAA 56 (L) 01/30/2023   CALCIUM 10.0 01/30/2023   PROT 6.7 05/24/2021   ALBUMIN 4.3 06/20/2018   LABGLOB 2.3 06/20/2018   AGRATIO 1.9 06/20/2018   BILITOT 0.8 05/24/2021   ALKPHOS 99 06/20/2018   AST 15 05/24/2021   ALT 12 05/24/2021   ANIONGAP 15  01/30/2023    Assessment and Plan: * Acute colitis Intractable nausea vomiting 1 episode of diarrhea over the past 24 hours with noted mild colitis on imaging White count within normal limits Afebrile Will otherwise monitor for now with symptomatic management GI panel ordered Reassess if there is any clinical decline   Obesity (BMI 30-39.9) BMI 39   HTN (hypertension) blood pressures 150s to 200s over 80s to 110s Titrate BP regimen  Follow     Greater than 50% was spent in counseling and coordination of care with patient Total encounter time 80 minutes or more    Advance Care Planning:   Code Status: Full Code   Consults: None   Family Communication: Son at the bedside   Severity of Illness: The appropriate patient status for this patient is OBSERVATION. Observation status is judged to be reasonable and necessary in order to provide the required intensity of service to ensure the patient's safety. The patient's presenting symptoms, physical exam findings, and initial radiographic and laboratory data in the context of their medical condition is felt to place them at decreased risk for further clinical deterioration. Furthermore, it is anticipated that the patient will be medically stable for  discharge from the hospital within 2 midnights of admission.   Author: Floydene Flock, MD 01/30/2023 9:03 AM  For on call review [email or web address].

## 2023-01-31 DIAGNOSIS — K529 Noninfective gastroenteritis and colitis, unspecified: Secondary | ICD-10-CM | POA: Diagnosis not present

## 2023-01-31 LAB — RESPIRATORY PANEL BY PCR

## 2023-01-31 LAB — COMPREHENSIVE METABOLIC PANEL
ALT: 13 U/L (ref 0–44)
AST: 17 U/L (ref 15–41)
Albumin: 3.7 g/dL (ref 3.5–5.0)
Alkaline Phosphatase: 77 U/L (ref 38–126)
Anion gap: 10 (ref 5–15)
BUN: 9 mg/dL (ref 8–23)
CO2: 21 mmol/L — ABNORMAL LOW (ref 22–32)
Calcium: 9 mg/dL (ref 8.9–10.3)
Chloride: 103 mmol/L (ref 98–111)
Creatinine, Ser: 0.88 mg/dL (ref 0.44–1.00)
GFR, Estimated: >60 mL/min (ref >60)
Glucose, Bld: 118 mg/dL — ABNORMAL HIGH (ref 70–99)
Potassium: 3.7 mmol/L (ref 3.5–5.1)
Sodium: 134 mmol/L — ABNORMAL LOW (ref 135–145)
Total Bilirubin: 1.1 mg/dL (ref 0.3–1.2)
Total Protein: 7.1 g/dL (ref 6.5–8.1)

## 2023-01-31 LAB — CBC
HCT: 39.1 % (ref 36.0–46.0)
Hemoglobin: 13.3 g/dL (ref 12.0–15.0)
MCH: 28.2 pg (ref 26.0–34.0)
MCHC: 34 g/dL (ref 30.0–36.0)
MCV: 82.8 fL (ref 80.0–100.0)
Platelets: 357 10*3/uL (ref 150–400)
RBC: 4.72 MIL/uL (ref 3.87–5.11)
RDW: 13.6 % (ref 11.5–15.5)
WBC: 10.1 10*3/uL (ref 4.0–10.5)
nRBC: 0 % (ref 0.0–0.2)

## 2023-01-31 LAB — MAGNESIUM: Magnesium: 1.8 mg/dL (ref 1.7–2.4)

## 2023-01-31 LAB — PHOSPHORUS: Phosphorus: 2.8 mg/dL (ref 2.5–4.6)

## 2023-01-31 MED ORDER — LORATADINE 10 MG PO TABS
10.0000 mg | ORAL_TABLET | Freq: Every day | ORAL | Status: DC
  Administered 2023-01-31 – 2023-02-01 (×2): 10 mg via ORAL
  Filled 2023-01-31 (×2): qty 1

## 2023-01-31 MED ORDER — AMLODIPINE BESYLATE 5 MG PO TABS: 5.0000 mg | ORAL_TABLET | Freq: Every day | ORAL | Status: DC

## 2023-01-31 MED ORDER — ACETAMINOPHEN 500 MG PO TABS
1000.0000 mg | ORAL_TABLET | Freq: Once | ORAL | Status: AC
  Administered 2023-01-31: 1000 mg via ORAL
  Filled 2023-01-31: qty 2

## 2023-01-31 MED ORDER — AMLODIPINE BESYLATE 5 MG PO TABS
5.0000 mg | ORAL_TABLET | Freq: Every day | ORAL | Status: DC
  Administered 2023-01-31 – 2023-02-01 (×2): 5 mg via ORAL
  Filled 2023-01-31 (×2): qty 1

## 2023-01-31 NOTE — Progress Notes (Signed)
[facility] Progress Note  Patient: Sherry Lowe    ZHY:[id number]  DOA: 01/30/2023     Date of Service: the patient was seen and examined on 01/31/2023  Chief Complaint  Patient presents with   Nausea   Dizziness   Brief hospital course: Sherry Lowe is a 72 y.o. female with medical history significant of obesity presenting with colitis.  Patient reports recurrent nausea and vomiting over the past 12-24 hours.  Patient reports being in otherwise normal state of health.  Had a salad yesterday with grilled chicken.  Patient states that was from a restaurant in the [place] area.  Had worsening nausea vomiting as well as abdominal pain from this point.  Emesis nonbloody nonbilious.  1 episode of diarrhea.  No chest pain or shortness of breath.  Has been unable to tolerate solid p.o. intake secondary to nausea.  No focal hemiparesis or confusion.  Patient states she does not take any medication on a regular basis.  No reported alcohol or tobacco use.  No reported sick contacts with similar symptoms. Presented to the ER afebrile, blood pressures 150s to 200s over 80s to 110s.  Satting well on room air.  White count 6, hemoglobin 13.4, platelets 178, creatinine 1.06, glucose 132.  CT imaging with possible colitis.  Noted sigmoid diverticulosis without diverticulitis mentioned.   Assessment and Plan:  Acute colitis Intractable nausea vomiting 1 episode of diarrhea over the past 24 hours with noted mild colitis on imaging White count within normal limits Afebrile Will otherwise monitor for now with symptomatic management GI panel ordered Reassess if there is any clinical decline     Obesity (BMI 30-39.9) BMI 39    HTN (hypertension) blood pressures 150s to 200s over 80s to 110s 10/10 started amlodipine 5 mg p.o. daily Titrate BP regimen  Follow    Body mass index is 39.06 kg/m.  Interventions:  Diet: Full liquid diet DVT Prophylaxis: Subcutaneous Lovenox   Advance goals of  care discussion: Full code  Family Communication: family was not present at bedside, at the time of interview.  The pt provided permission to discuss medical plan with the family. Opportunity was given to ask question and all questions were answered satisfactorily.   Disposition:  Pt is from Home, admitted with colitis, still has Pain and on FLD, which precludes a safe discharge. Discharge to Home, when stabble, most likely tomorrow a.m.  Subjective: No significant events overnight, patient is tolerating full liquid diet and would like to keep same today, does not feel to advance diet.  Denies any vomiting but still has nausea, pain has been resolved, no diarrhea.  Physical Exam: General: NAD, lying comfortably Appear in no distress, affect appropriate Eyes: PERRLA ENT: Oral Mucosa Clear, moist  Neck: no JVD,  Cardiovascular: S1 and S2 Present, no Murmur,  Respiratory: good respiratory effort, Bilateral Air entry equal and Decreased, no Crackles, no wheezes Abdomen: Bowel Sound present, Soft and mild generalized tenderness,  Skin: no rashes Extremities: no Pedal edema, no calf tenderness Neurologic: without any new focal findings Gait not checked due to patient safety concerns  Vitals:   01/30/23 1951 01/31/23 0427 01/31/23 1006 01/31/23 1505  BP: (!) 153/78 (!) 152/86 (!) 148/81 (!) 143/85  Pulse: 85 79 82 81  Resp: 18 18 18 18   Temp: 99 F (37.2 C) 98.6 F (37 C) 99.1 F (37.3 C) 99 F (37.2 C)  TempSrc: Oral Oral Oral Oral  SpO2: 93% 94% 99% 95%  Weight:  Intake/Output Summary (Last 24 hours) at 01/31/2023 1534 Last data filed at 01/31/2023 0316 Gross per 24 hour  Intake 2000 ml  Output --  Net 2000 ml   Filed Weights   01/30/23 0224  Weight: 109.8 kg    Data Reviewed: I have personally reviewed and interpreted daily labs, tele strips, imagings as discussed above. I reviewed all nursing notes, pharmacy notes, vitals, pertinent old records I have  discussed plan of care as described above with RN and patient/family.  CBC: Recent Labs  Lab 01/30/23 0035 01/31/23 0556  WBC 6.0 10.1  HGB 13.4 13.3  HCT 41.5 39.1  MCV 86.5 82.8  PLT 178 357   Basic Metabolic Panel: Recent Labs  Lab 01/30/23 0035 01/31/23 0556  NA 139 134*  K 4.2 3.7  CL 103 103  CO2 21* 21*  GLUCOSE 132* 118*  BUN 14 9  CREATININE 1.06* 0.88  CALCIUM 10.0 9.0  MG  --  1.8  PHOS  --  2.8    Studies: No results found.  Scheduled Meds:  amLODipine  5 mg Oral Daily   enoxaparin (LOVENOX) injection  0.5 mg/kg Subcutaneous Q24H   loratadine  10 mg Oral Daily   Continuous Infusions: PRN Meds: hydrALAZINE, ondansetron **OR** ondansetron (ZOFRAN) IV  Time spent: 35 minutes  Author: Gillis Santa. MD [facility] 01/31/2023 3:34 PM  To reach On-call, see care teams to locate the attending and reach out to them via [email or web address]. If 7PM-7AM, please contact night-coverage If you still have difficulty reaching the attending provider, please page the [facility] (Director on Call) for [facility] on amion for assistance.

## 2023-01-31 NOTE — Plan of Care (Signed)

## 2023-02-01 DIAGNOSIS — K529 Noninfective gastroenteritis and colitis, unspecified: Secondary | ICD-10-CM | POA: Diagnosis not present

## 2023-02-01 LAB — BASIC METABOLIC PANEL
Anion gap: 10 (ref 5–15)
BUN: 11 mg/dL (ref 8–23)
CO2: 23 mmol/L (ref 22–32)
Calcium: 9 mg/dL (ref 8.9–10.3)
Chloride: 104 mmol/L (ref 98–111)
Creatinine, Ser: 0.99 mg/dL (ref 0.44–1.00)
GFR, Estimated: >60 mL/min (ref >60)
Glucose, Bld: 115 mg/dL — ABNORMAL HIGH (ref 70–99)
Potassium: 3.7 mmol/L (ref 3.5–5.1)
Sodium: 137 mmol/L (ref 135–145)

## 2023-02-01 LAB — CBC
HCT: 41.8 % (ref 36.0–46.0)
Hemoglobin: 13.6 g/dL (ref 12.0–15.0)
MCH: 27.6 pg (ref 26.0–34.0)
MCHC: 32.5 g/dL (ref 30.0–36.0)
MCV: 84.8 fL (ref 80.0–100.0)
Platelets: 342 10*3/uL (ref 150–400)
RBC: 4.93 MIL/uL (ref 3.87–5.11)
RDW: 13.6 % (ref 11.5–15.5)
WBC: 8 10*3/uL (ref 4.0–10.5)
nRBC: 0 % (ref 0.0–0.2)

## 2023-02-01 LAB — PHOSPHORUS: Phosphorus: 2.7 mg/dL (ref 2.5–4.6)

## 2023-02-01 LAB — MAGNESIUM: Magnesium: 1.9 mg/dL (ref 1.7–2.4)

## 2023-02-01 MED ORDER — AMLODIPINE BESYLATE 5 MG PO TABS: 5.0000 mg | ORAL_TABLET | Freq: Every day | ORAL | 1 refills | Status: DC

## 2023-02-01 NOTE — Progress Notes (Signed)
During patient round patient reported that IV site was painful, assessed patients surrounding tissue was pink, swollen, and nodule felt to right of IV site. Removed IV patient tolerated well  and elevated arm.

## 2023-02-01 NOTE — Progress Notes (Signed)
Discharge education completed. Patient wheeled to the [facility] exit by [facility], in stable condition, to be discharged to the care of her son.  Sherry Lowe

## 2023-02-01 NOTE — TOC CM/SW Note (Signed)
Transition of Care [facility]) - Inpatient Brief Assessment   Patient Details  Name: Sherry Lowe MRN: [id number] Date of Birth: 04-27-1950  Transition of Care [facility]) CM/SW Contact:    Margarito Liner, LCSW Phone Number: 02/01/2023, 9:39 AM   Clinical Narrative: CSW reviewed chart. No TOC needs identified so far. CSW will continue to follow progress. Please place [facility] consult if any needs arise.  Transition of Care Asessment: Insurance and Status: Insurance coverage has been reviewed Patient has primary care physician: Yes Home environment has been reviewed: Single family home Prior level of function:: Not documented Prior/Current Home Services: No current home services Social Determinants of Health Reivew: SDOH reviewed no interventions necessary Readmission risk has been reviewed: Yes Transition of care needs: no transition of care needs at this time

## 2023-02-01 NOTE — Discharge Summary (Signed)
[facility] Discharge Summary   Patient: Sherry Lowe ZOX:[id number]  PCP: Margarita Mail, DO  Date of admission: 01/30/2023   Date of discharge:  02/01/2023     Discharge Diagnoses:  Principal Problem:   Acute colitis Active Problems:   HTN (hypertension)   Obesity (BMI 30-39.9)   Admitted From: Home Disposition: Home  Recommendations for Outpatient Follow-up:  Follow-up with PCP in 1 week, continue to monitor BP at home and follow with PCP to titrate medications accordingly. Started amlodipine 5 mg p.o. daily, skip the dose if systolic BP less than 130 mmHg.  Follow up LABS/TEST:     Diet recommendation: Cardiac diet  Activity: The patient is advised to gradually reintroduce usual activities, as tolerated  Discharge Condition: stable  Code Status: Full code   History of present illness: As per the H and P dictated on admission Hospital Course:  Sherry Lowe is a 72 y.o. female with medical history significant of obesity presenting with colitis.  Patient reports recurrent nausea and vomiting over the past 12-24 hours.  Patient reports being in otherwise normal state of health.  Had a salad yesterday with grilled chicken.  Patient states that was from a restaurant in the [place] area.  Had worsening nausea vomiting as well as abdominal pain from this point.  Emesis nonbloody nonbilious.  1 episode of diarrhea.  No chest pain or shortness of breath.  Has been unable to tolerate solid p.o. intake secondary to nausea.  No focal hemiparesis or confusion.  Patient states she does not take any medication on a regular basis.  No reported alcohol or tobacco use.  No reported sick contacts with similar symptoms. Presented to the ER afebrile, blood pressures 150s to 200s over 80s to 110s.  Satting well on room air.  White count 6, hemoglobin 13.4, platelets 178, creatinine 1.06, glucose 132.  CT imaging with possible colitis.  Noted sigmoid diverticulosis without diverticulitis  mentioned.     Assessment and Plan: # Acute colitis: Intractable nausea vomiting 1 episode of diarrhea over the past 24 hours with noted mild colitis on imaging. White count within normal limits. Afebrile.  Patient did not have any diarrhea during hospital stay, abdominal pain resolved.  No nausea vomiting.  Gradually diet was advanced, tolerated well.  Patient feels comfortable going home today.  GI pathogen was ordered but it was not collected as patient had no diarrhea. # HTN (hypertension), blood pressure slightly elevated, patient is not on any medication at home. On 10/10 started amlodipine 5 mg p.o. daily.  Patient was advised to monitor BP at home and follow with PCP to titrate medication accordingly. # Obesity:  Body mass index is 39.06 kg/m.  Nutrition Interventions:  Patient was ambulatory without any assistance. On the day of the discharge the patient's vitals were stable, and no other acute medical condition were reported by patient. the patient was felt safe to be discharge at Home.  Consultants: None Procedures: None  Discharge Exam: General: Appear in no distress, no Rash; Oral Mucosa Clear, moist. Cardiovascular: S1 and S2 Present, no Murmur, Respiratory: normal respiratory effort, Bilateral Air entry present and no Crackles, no wheezes Abdomen: Bowel Sound present, Soft and no tenderness, no hernia Extremities: no Pedal edema, no calf tenderness Neurology: alert and oriented to time, place, and person affect appropriate.  Filed Weights   01/30/23 0224  Weight: 109.8 kg   Vitals:   02/01/23 0418 02/01/23 0816  BP: (!) 145/80 133/79  Pulse: 74 72  Resp: 18 17  Temp: 99.2 F (37.3 C) 98.2 F (36.8 C)  SpO2: 95% 96%    DISCHARGE MEDICATION: Allergies as of 02/01/2023       Reactions   Alprazolam Hives   Sulfa Antibiotics Hives   Sulfa Drugs Cross Reactors Itching   Zithromax [azithromycin]         Medication List     TAKE these medications     amLODipine 5 MG tablet Commonly known as: NORVASC Take 1 tablet (5 mg total) by mouth daily. Skip the dose if systolic blood pressure less than 130 mmHg Start taking on: February 02, 2023       Allergies  Allergen Reactions   Alprazolam Hives   Sulfa Antibiotics Hives   Sulfa Drugs Cross Reactors Itching   Zithromax [Azithromycin]    Discharge Instructions     Call MD for:  extreme fatigue   Complete by: As directed    Call MD for:  persistant dizziness or light-headedness   Complete by: As directed    Call MD for:  persistant nausea and vomiting   Complete by: As directed    Call MD for:  severe uncontrolled pain   Complete by: As directed    Call MD for:  temperature >100.4   Complete by: As directed    Diet - low sodium heart healthy   Complete by: As directed    Discharge instructions   Complete by: As directed    Follow-up with PCP in 1 week, continue to monitor BP at home and follow with PCP to titrate medications accordingly.  Started amlodipine 5 mg p.o. daily, skip the dose if systolic BP less than 130 mmHg.   Increase activity slowly   Complete by: As directed        The results of significant diagnostics from this hospitalization (including imaging, microbiology, ancillary and laboratory) are listed below for reference.    Significant Diagnostic Studies: CT ABDOMEN PELVIS W CONTRAST  Result Date: 01/30/2023 CLINICAL DATA:  Abdominal pain, nausea vomiting and diarrhea. EXAM: CT ABDOMEN AND PELVIS WITH CONTRAST TECHNIQUE: Multidetector CT imaging of the abdomen and pelvis was performed using the standard protocol following bolus administration of intravenous contrast. RADIATION DOSE REDUCTION: This exam was performed according to the departmental dose-optimization program which includes automated exposure control, adjustment of the mA and/or kV according to patient size and/or use of iterative reconstruction technique. CONTRAST:  OMNIPAQUE IOHEXOL 350  MG/ML SOLN COMPARISON:  None Available. FINDINGS: Lower chest: The visualized lung bases are clear. No intra-abdominal free air or free fluid. Hepatobiliary: The liver is unremarkable. No biliary ductal dilatation. The gallbladder is unremarkable Pancreas: Unremarkable. No pancreatic ductal dilatation or surrounding inflammatory changes. Spleen: Normal in size without focal abnormality. Adrenals/Urinary Tract: The adrenal glands are unremarkable. There is no hydronephrosis on either side. There is symmetric enhancement and excretion of contrast by both kidneys. Small left renal parapelvic cysts. The visualized ureters and urinary bladder appear unremarkable. Stomach/Bowel: Mild sigmoid diverticulosis without active inflammatory changes. There is thickened appearance of the majority of the colon, likely related to underdistention. Colitis is less likely. Clinical correlation is recommended. There is no bowel obstruction. The appendix is normal. Vascular/Lymphatic: The abdominal aorta and IVC are unremarkable. No portal venous gas. There is no adenopathy. Reproductive: Hysterectomy.  No adnexal masses. Other: None Musculoskeletal: Lower lumbar facet arthropathy. No acute osseous pathology. IMPRESSION: 1. Underdistention of the colon versus less likely colitis. Clinical correlation is recommended. 2. Mild sigmoid diverticulosis.  No bowel obstruction. Normal appendix. Electronically Signed   By: Elgie Collard M.D.   On: 01/30/2023 03:43    Microbiology: Recent Results (from the past 240 hour(s))  Respiratory (~20 pathogens) panel by PCR     Status: None   Collection Time: 01/30/23  8:35 PM   Specimen: Nasopharyngeal Swab; Respiratory  Result Value Ref Range Status   Adenovirus NOT DETECTED NOT DETECTED Final   Coronavirus 229E NOT DETECTED NOT DETECTED Final    Comment: (NOTE) The Coronavirus on the Respiratory Panel, DOES NOT test for the novel  Coronavirus (2019 nCoV)    Coronavirus HKU1 NOT DETECTED  NOT DETECTED Final   Coronavirus NL63 NOT DETECTED NOT DETECTED Final   Coronavirus OC43 NOT DETECTED NOT DETECTED Final   Metapneumovirus NOT DETECTED NOT DETECTED Final   Rhinovirus / Enterovirus NOT DETECTED NOT DETECTED Final   Influenza A NOT DETECTED NOT DETECTED Final   Influenza B NOT DETECTED NOT DETECTED Final   Parainfluenza Virus 1 NOT DETECTED NOT DETECTED Final   Parainfluenza Virus 2 NOT DETECTED NOT DETECTED Final   Parainfluenza Virus 3 NOT DETECTED NOT DETECTED Final   Parainfluenza Virus 4 NOT DETECTED NOT DETECTED Final   Respiratory Syncytial Virus NOT DETECTED NOT DETECTED Final   Bordetella pertussis NOT DETECTED NOT DETECTED Final   Bordetella Parapertussis NOT DETECTED NOT DETECTED Final   Chlamydophila pneumoniae NOT DETECTED NOT DETECTED Final   Mycoplasma pneumoniae NOT DETECTED NOT DETECTED Final    Comment: Performed at [facility], [address], [place], [place] [address]     Labs: CBC: Recent Labs  Lab 01/30/23 0035 01/31/23 0556 02/01/23 0612  WBC 6.0 10.1 8.0  HGB 13.4 13.3 13.6  HCT 41.5 39.1 41.8  MCV 86.5 82.8 84.8  PLT 178 357 342   Basic Metabolic Panel: Recent Labs  Lab 01/30/23 0035 01/31/23 0556 02/01/23 0612  NA 139 134* 137  K 4.2 3.7 3.7  CL 103 103 104  CO2 21* 21* 23  GLUCOSE 132* 118* 115*  BUN 14 9 11   CREATININE 1.06* 0.88 0.99  CALCIUM 10.0 9.0 9.0  MG  --  1.8 1.9  PHOS  --  2.8 2.7   Liver Function Tests: Recent Labs  Lab 01/31/23 0556  AST 17  ALT 13  ALKPHOS 77  BILITOT 1.1  PROT 7.1  ALBUMIN 3.7   No results for input(s): "LIPASE", "AMYLASE" in the last 168 hours. No results for input(s): "AMMONIA" in the last 168 hours. Cardiac Enzymes: No results for input(s): "CKTOTAL", "CKMB", "CKMBINDEX", "TROPONINI" in the last 168 hours. BNP (last 3 results) No results for input(s): "BNP" in the last 8760 hours. CBG: No results for input(s): "GLUCAP" in the last 168 hours.  Time spent: 35  minutes  Signed:  Gillis Santa  [facility] 02/01/2023 11:58 AM

## 2023-02-01 NOTE — Plan of Care (Signed)
Resolve care plan. Adequate for discharge.  Sherry Lowe Sherry Lowe

## 2023-02-04 ENCOUNTER — Encounter: Payer: Self-pay | Admitting: Family Medicine

## 2023-02-04 ENCOUNTER — Ambulatory Visit: Payer: Self-pay

## 2023-02-04 ENCOUNTER — Ambulatory Visit: Payer: BC Managed Care – PPO | Admitting: Family Medicine

## 2023-02-04 VITALS — BP 134/61 | HR 103 | Temp 99.6°F | Ht 66.0 in | Wt 238.9 lb

## 2023-02-04 DIAGNOSIS — L03114 Cellulitis of left upper limb: Secondary | ICD-10-CM

## 2023-02-04 MED ORDER — CEFDINIR 300 MG PO CAPS
300.0000 mg | ORAL_CAPSULE | Freq: Two times a day (BID) | ORAL | 0 refills | Status: AC
Start: 2023-02-04 — End: 2023-02-11

## 2023-02-04 MED ORDER — DOXYCYCLINE HYCLATE 100 MG PO TABS
100.0000 mg | ORAL_TABLET | Freq: Two times a day (BID) | ORAL | 0 refills | Status: AC
Start: 2023-02-04 — End: 2023-02-14

## 2023-02-04 MED ORDER — CEFTRIAXONE SODIUM 500 MG IJ SOLR
500.0000 mg | Freq: Once | INTRAMUSCULAR | Status: AC
Start: 2023-02-04 — End: 2023-02-04
  Administered 2023-02-04: 500 mg via INTRAMUSCULAR

## 2023-02-04 MED ORDER — LIDOCAINE HCL 1 % IJ SOLN
1.8000 mL | Freq: Once | INTRAMUSCULAR | Status: AC
Start: 2023-02-04 — End: 2023-02-04
  Administered 2023-02-04: 1.8 mL via INTRADERMAL

## 2023-02-04 MED ORDER — CEFTRIAXONE SODIUM 1 G IJ SOLR
1.0000 g | Freq: Once | INTRAMUSCULAR | Status: DC
Start: 2023-02-04 — End: 2023-02-04

## 2023-02-04 MED ORDER — LIDOCAINE HCL 1 % IJ SOLN
10.0000 mL | Freq: Once | INTRAMUSCULAR | Status: AC
Start: 2023-02-04 — End: 2023-02-04
  Administered 2023-02-04: 1.8 mL via INTRADERMAL

## 2023-02-04 NOTE — Progress Notes (Signed)
      Established patient visit   Patient: Sherry Lowe   DOB: 1950-10-07   72 y.o. Female  MRN: [id number] Visit Date: 02/04/2023  Today's healthcare provider: Mila Merry, MD   Chief Complaint  Patient presents with   Arm Pain    Pt states she was in the hospital, states her arm is sore, in pain and swelling. Since Friday.   Subjective    Discussed the use of AI scribe software for clinical note transcription with the patient, who gave verbal consent to proceed.  History of Present Illness   The patient was recently hospitalized due to nausea and vomiting. The exact cause of the stomach issues was not determined during the hospital stay. She reports that she received IV fluids and two different types of nausea medication. The patient also had a significantly elevated. She states that she noticed swelling in the arm where the IV was inserted. The swelling has progressively worsened since discharge, causing stiffness and pain, which has disrupted the patient's sleep. The patient denies any discharge from the swollen area.  The patient has a known allergy to sulfa and Zithromax.       Medications: Outpatient Medications Prior to Visit  Medication Sig   amLODipine (NORVASC) 5 MG tablet Take 1 tablet (5 mg total) by mouth daily. Skip the dose if systolic blood pressure less than 130 mmHg   No facility-administered medications prior to visit.   Review of Systems     Objective    BP 134/61   Pulse (!) 103   Temp 99.6 F (37.6 C) (Oral)   Ht 5\' 6"  (1.676 m)   Wt 238 lb 14.4 oz (108.4 kg)   SpO2 98%   BMI 38.56 kg/m    Physical Exam   VITALS: BP- 134/60 SKIN: Arm swelling, tenderness, palpable knot, significant swelling around the elbow, no drainage.    Left antecubital fossae    Assessment & Plan        Cellulitis  -Administer Rocephin 1g IM today. -Prescribe cefdinir and doxycycline to cover hospital and community-acquired bacteria. -Schedule follow-up  appointment at her PCPs practice ([facility]) in 2 days.         Mila Merry, MD  [facility] [phone] (phone) [phone] (fax)  [facility]

## 2023-02-04 NOTE — Patient Instructions (Signed)
.   Please review the attached list of medications and notify my office if there are any errors.   . Please bring all of your medications to every appointment so we can make sure that our medication list is the same as yours.   

## 2023-02-04 NOTE — Telephone Encounter (Signed)
Chief Complaint: Arm swelling Symptoms: swelling near elbow, pain 8/10 Frequency: constant Pertinent Negatives: Patient denies chest pain, fever, redness Disposition: [] ED /[] Urgent Care (no appt availability in office) / [x] Appointment(In office/virtual)/ []  [facility]/ [] Home Care/ [] Refused Recommended Disposition /[] [place] [facility]/ []  Follow-up with PCP Additional Notes: Patient states she has swelling in her arm near the [facility] area where she had and IV placed while she was in the hospital last week. Patient states the area is painful reporting 8/10 pain and she can feel a knot in the area. Patient reports she has tried warm and cold compress over the area to decrease the swelling and pain but it has not been helpful. Care advice given and patient has been scheduled at [facility] today at 1100 with Dr. Sherrie Mustache for an acute visit.   Reason for Disposition  MODERATE arm swelling (e.g., puffiness or swollen feeling of entire arm)  Answer Assessment - Initial Assessment Questions 1. ONSET: "When did the swelling start?" (e.g., minutes, hours, days)     Friday 2. LOCATION: "What part of the arm is swollen?"  "Are both arms swollen or just one arm?"     Near the elbow and up  3. SEVERITY: "How bad is the swelling?" (e.g., localized; mild, moderate, severe)   - LOCALIZED: Small area of puffiness or swelling on just one arm   - JOINT SWELLING: Swelling of one joint   - MILD: Puffiness or swelling of hand   - MODERATE: Puffiness or swollen feeling of entire arm    - SEVERE: All of arm looks swollen; pitting edema     moderate 4. REDNESS: "Does the swelling look red or infected?"     A little redness is there  5. PAIN: "Is the swelling painful to touch?" If Yes, ask: "How painful is it?"   (Scale 1-10; mild, moderate or severe)     8/10 6. FEVER: "Do you have a fever?" If Yes, ask: "What is it, how was it measured, and when did it start?"      No  7. CAUSE: "What do you think is  causing the arm swelling?"     I had an IV in place last week 9. RECURRENT SYMPTOM: "Have you had arm swelling before?" If Yes, ask: "When was the last time?" "What happened that time?"     No 10. OTHER SYMPTOMS: "Do you have any other symptoms?" (e.g., chest pain, difficulty breathing)       No  Protocols used: Arm Swelling and Edema-A-AH

## 2023-02-04 NOTE — Addendum Note (Signed)
Addended by: Lily Kocher on: 02/04/2023 02:05 PM   Modules accepted: Orders

## 2023-02-05 ENCOUNTER — Ambulatory Visit
Admission: RE | Admit: 2023-02-05 | Discharge: 2023-02-05 | Disposition: A | Discharge status: Home / Self Care | Payer: BC Managed Care – PPO | Source: Ambulatory Visit | Attending: Internal Medicine | Admitting: Internal Medicine

## 2023-02-05 ENCOUNTER — Encounter: Payer: Self-pay | Admitting: Internal Medicine

## 2023-02-05 ENCOUNTER — Ambulatory Visit (INDEPENDENT_AMBULATORY_CARE_PROVIDER_SITE_OTHER): Payer: BC Managed Care – PPO | Admitting: Internal Medicine

## 2023-02-05 VITALS — BP 124/80 | HR 119 | Temp 97.8°F | Resp 18 | Ht 65.0 in | Wt 238.8 lb

## 2023-02-05 DIAGNOSIS — L03114 Cellulitis of left upper limb: Secondary | ICD-10-CM

## 2023-02-05 DIAGNOSIS — I82612 Acute embolism and thrombosis of superficial veins of left upper extremity: Secondary | ICD-10-CM | POA: Diagnosis not present

## 2023-02-05 DIAGNOSIS — M7989 Other specified soft tissue disorders: Secondary | ICD-10-CM

## 2023-02-05 DIAGNOSIS — I808 Phlebitis and thrombophlebitis of other sites: Secondary | ICD-10-CM | POA: Diagnosis not present

## 2023-02-05 NOTE — Progress Notes (Signed)
Established Patient Office Visit  Subjective   Patient ID: Sherry Lowe, female    DOB: 1950-08-08  Age: 72 y.o. MRN: [id number]  Chief Complaint  Patient presents with   Hospitalization Follow-up   Cellulitis    Left arm from IV, states hand is swollen today red down arm and hot to touch.  Seen Dr. Sherrie Mustache yesterday given 2 oral antibiotics and shot of rocephin.  She has not started oral antibiotics due to it not being ready until today.    HPI  Patient is here today for hospital follow up.   Discharge Date: 02/01/23 Diagnosis: acute colitis, HTN Procedures/tests: Labs WNL, CT A/P with underdistention of the colon vs. Colitis and mild sigmoid diverticulosis  Consultants: None New medications: Amlodipine 5 mg Status: better  Presented to the ER for nausea, vomiting and one large BM. No abdominal pain, no fevers. Discharged home and symptoms resolved, however she was seen yesterday at [facility] for cellulitis and over IV site. Was treated with Rocephin IM and Cefdinir 300 mg BID x 7 days and Doxycycline 100 mg BID x 10 days but did not start oral antibiotics yet. Today she states the area of erythema is about the same and the pain has improved, however she is much more swollen area the wound and down into her wrist and hand with mild restriction in motion. No fevers. Wound now apparent with purulent drainage.   Patient Active Problem List   Diagnosis Date Noted   Acute colitis 01/30/2023   Screen for colon cancer 12/25/2016   Vaccine refused by patient 12/20/2016   Vaccine counseling 08/18/2015   Screening for condition 08/18/2015   Screen for STD (sexually transmitted disease) 08/18/2015   Encounter for health maintenance examination in adult 08/18/2015   Routine general medical examination at a health care facility 10/29/2011   HTN (hypertension) 04/20/2011   Obesity (BMI 30-39.9) 04/20/2011   Past Medical History:  Diagnosis Date   Arthritis    H/O  bilateral oophorectomy 1985   along with hysterectomy   H/O cardiac catheterization 2005   reportedly normal per patient   History of bone density study    2012, 2017 normal   History of UTI    Obesity    S/P hysterectomy 1995   Seasonal allergies    Shingles 2014   Wears glasses    Past Surgical History:  Procedure Laterality Date   CARDIAC CATHETERIZATION  1995   reportedly normal per patient.     COLONOSCOPY  2013   normal per patient   VAGINAL HYSTERECTOMY  1985   total, due to fibroids   WISDOM TOOTH EXTRACTION     Social History   Tobacco Use   Smoking status: Never   Smokeless tobacco: Never  Vaping Use   Vaping status: Never Used  Substance Use Topics   Alcohol use: No   Drug use: No   Social History   Socioeconomic History   Marital status: Divorced    Spouse name: Not on file   Number of children: 1   Years of education: Not on file   Highest education level: Bachelor's degree (e.g., BA, AB, BS)  Occupational History   Not on file  Tobacco Use   Smoking status: Never   Smokeless tobacco: Never  Vaping Use   Vaping status: Never Used  Substance and Sexual Activity   Alcohol use: No   Drug use: No   Sexual activity: Yes    Partners: Male  Other Topics Concern   Not on file  Social History Narrative   She lives alone.  She has one grown son.   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      Was prior working as a Arts development officer as a Acupuncturist.     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   [employer]   Social Determinants of Health   Financial Resource Strain: Low Risk  (08/28/2022)   Overall Financial Resource Strain (CARDIA)    Difficulty of Paying Living Expenses: Not hard at all  Food Insecurity: No Food Insecurity (01/30/2023)   Hunger Vital Sign    Worried About Running Out of Food in the Last Year: Never true    Ran Out of Food in the Last Year: Never true   Transportation Needs: No Transportation Needs (01/30/2023)   PRAPARE - Administrator, Civil Service (Medical): No    Lack of Transportation (Non-Medical): No  Physical Activity: Inactive (08/28/2022)   Exercise Vital Sign    Days of Exercise per Week: 0 days    Minutes of Exercise per Session: 10 min  Stress: No Stress Concern Present (08/28/2022)   [employer] of Occupational Health - Occupational Stress Questionnaire    Feeling of Stress : Only a little  Social Connections: Moderately Integrated (08/28/2022)   Social Connection and Isolation Panel [NHANES]    Frequency of Communication with Friends and Family: More than three times a week    Frequency of Social Gatherings with Friends and Family: More than three times a week    Attends Religious Services: More than 4 times per year    Active Member of [employer] or Organizations: Yes    Attends Engineer, structural: More than 4 times per year    Marital Status: Divorced  Intimate Partner Violence: Not At Risk (01/30/2023)   Humiliation, Afraid, Rape, and Kick questionnaire    Fear of Current or Ex-Partner: No    Emotionally Abused: No    Physically Abused: No    Sexually Abused: No   Family Status  Relation Name Status   Father  Alive   Mother  Alive   Brother  Alive   Brother  Alive   MGM  (Not Specified)   MGF  (Not Specified)  No partnership data on file   Family History  Problem Relation Age of Onset   Hyperlipidemia Father        Living, 9   Cancer Father        prostate   Hypertension Father    Hyperlipidemia Mother        Living 9   Healthy Brother        x2   Cancer Maternal Grandmother        stomach   Stroke Maternal Grandfather    Allergies  Allergen Reactions   Alprazolam Hives   Sulfa Antibiotics Hives   Sulfa Drugs Cross Reactors Itching   Zithromax [Azithromycin]       Review of Systems  Constitutional:  Negative for chills and fever.  Skin:  Positive for rash.       Objective:     BP 124/80   Pulse (!) 119   Temp 97.8 F (36.6 C)   Resp 18   Ht 5\' 5"  (1.651 m)   Wt 238 lb 12.8 oz (108.3 kg)   SpO2 96%   BMI 39.74 kg/m  BP Readings from Last 3 Encounters:  02/05/23 124/80  02/04/23 134/61  02/01/23 133/79   Wt Readings from Last 3 Encounters:  02/05/23 238 lb 12.8 oz (108.3 kg)  02/04/23 238 lb 14.4 oz (108.4 kg)  01/30/23 242 lb (109.8 kg)      Physical Exam Constitutional:      Appearance: Normal appearance.  HENT:     Head: Normocephalic and atraumatic.  Eyes:     Conjunctiva/sclera: Conjunctivae normal.  Cardiovascular:     Rate and Rhythm: Normal rate and regular rhythm.  Pulmonary:     Effort: Pulmonary effort is normal.     Breath sounds: Normal breath sounds.  Musculoskeletal:        General: Swelling present. No tenderness.  Skin:    General: Skin is warm and dry.     Comments: See picture below  Neurological:     General: No focal deficit present.     Mental Status: She is alert. Mental status is at baseline.  Psychiatric:        Mood and Affect: Mood normal.        Behavior: Behavior normal.      No results found for any visits on 02/05/23.  Last CBC Lab Results  Component Value Date   WBC 8.0 02/01/2023   HGB 13.6 02/01/2023   HCT 41.8 02/01/2023   MCV 84.8 02/01/2023   MCH 27.6 02/01/2023   RDW 13.6 02/01/2023   PLT 342 02/01/2023   Last metabolic panel Lab Results  Component Value Date   GLUCOSE 115 (H) 02/01/2023   NA 137 02/01/2023   K 3.7 02/01/2023   CL 104 02/01/2023   CO2 23 02/01/2023   BUN 11 02/01/2023   CREATININE 0.99 02/01/2023   GFRNONAA >60 02/01/2023   CALCIUM 9.0 02/01/2023   PHOS 2.7 02/01/2023   PROT 7.1 01/31/2023   ALBUMIN 3.7 01/31/2023   LABGLOB 2.3 06/20/2018   AGRATIO 1.9 06/20/2018   BILITOT 1.1 01/31/2023   ALKPHOS 77 01/31/2023   AST 17 01/31/2023   ALT 13 01/31/2023   ANIONGAP 10 02/01/2023   Last lipids Lab Results  Component Value Date   CHOL  229 (H) 05/24/2021   HDL 81 05/24/2021   LDLCALC 125 (H) 05/24/2021   LDLDIRECT 120.7 10/19/2011   TRIG 122 05/24/2021   CHOLHDL 2.8 05/24/2021   Last hemoglobin A1c Lab Results  Component Value Date   HGBA1C 5.4 08/28/2022   Last thyroid functions Lab Results  Component Value Date   TSH 1.763 02/25/2014   Last vitamin D No results found for: "25OHVITD2", "25OHVITD3", "VD25OH" Last vitamin B12 and Folate No results found for: "VITAMINB12", "FOLATE"    The 10-year ASCVD risk score (Arnett DK, et al., 2019) is: 15.3%    Assessment & Plan:   1. Swelling of left lower extremity/Cellulitis of left upper extremity: Wound culture taken of wound, she will start Cefdinir and Doxycycline today. Stat US to rule out blood clot ordered as well. Discussed criteria to return to ER. Follow up here in 1 week.   - [place] UPPER EXTREMITY DUPLEX LEFT (NON-WBI); Future - Wound culture   Return in about 1 week (around 02/12/2023).    Margarita Mail, DO

## 2023-02-06 ENCOUNTER — Ambulatory Visit: Payer: Self-pay | Admitting: *Deleted

## 2023-02-06 MED ORDER — PREDNISONE 20 MG PO TABS
20.0000 mg | ORAL_TABLET | Freq: Every day | ORAL | 0 refills | Status: AC
Start: 2023-02-06 — End: 2023-02-09

## 2023-02-06 NOTE — Telephone Encounter (Addendum)
  Chief Complaint: Sherry Lowe with [facility] called in a stat U/S report for left upper extremity swelling. Symptoms: N/A Frequency: N/A Pertinent Negatives: Patient denies N/A Disposition: [] ED /[] Urgent Care (no appt availability in office) / [] Appointment(In office/virtual)/ []  [facility]/ [] Home Care/ [] Refused Recommended Disposition /[] [place] [facility]/ [x]  Follow-up with PCP Additional Notes: See report in Epic.    Positive for DVT.    I called into the practice and spoke with the nurse Asher Muir.   She said they had gotten a call last night about this and thanked me for calling.

## 2023-02-06 NOTE — Addendum Note (Signed)
Addended by: Margarita Mail on: 02/06/2023 08:18 AM   Modules accepted: Orders

## 2023-02-06 NOTE — Telephone Encounter (Signed)
Reason for Disposition  [1] Caller requesting NON-URGENT health information AND [2] PCP's office is the best resource    Stat U/S of left upper extremity due to swelling.  U/S report called in.  Answer Assessment - Initial Assessment Questions 1. REASON FOR CALL or QUESTION: "What is your reason for calling today?" or "How can I best help you?" or "What question do you have that I can help answer?"     Sherry Lowe with [facility] calling in a stat report from an U/S done 02/05/2023 due to upper extremity swelling. I'm sending a high priority message to Dr. Caralee Ates.  Protocols used: Information Only Call - No Triage-A-AH

## 2023-02-07 ENCOUNTER — Ambulatory Visit (INDEPENDENT_AMBULATORY_CARE_PROVIDER_SITE_OTHER): Payer: BC Managed Care – PPO | Admitting: Internal Medicine

## 2023-02-07 ENCOUNTER — Encounter: Payer: Self-pay | Admitting: Internal Medicine

## 2023-02-07 VITALS — BP 118/76 | HR 104 | Temp 97.3°F | Resp 18 | Ht 65.0 in | Wt 238.4 lb

## 2023-02-07 DIAGNOSIS — I808 Phlebitis and thrombophlebitis of other sites: Secondary | ICD-10-CM | POA: Diagnosis not present

## 2023-02-07 DIAGNOSIS — L03114 Cellulitis of left upper limb: Secondary | ICD-10-CM

## 2023-02-07 NOTE — Progress Notes (Signed)
Established Patient Office Visit  Subjective   Patient ID: Sherry Lowe, female    DOB: [birth year]  Age: 72 y.o. MRN: [id number]  Chief Complaint  Patient presents with   Cellulitis    recheck    HPI  Patient is here today for follow up on cellulitis and superficial thrombophlebitis.   Was treated with Rocephin IM and Cefdinir 300 mg BID x 7 days and Doxycycline 100 mg BID x 10 days and is now 3 days and to total course.  She was also prescribed prednisone 20 mg for 3 days to decrease inflammation and swelling.  Today she states she has better range of motion and is able to grip with her right hand.  Pain, redness and warmth still there but much improved.  No fevers.  Drainage has resolved.  Patient Active Problem List   Diagnosis Date Noted   Acute colitis 01/30/2023   Screen for colon cancer 12/25/2016   Vaccine refused by patient 12/20/2016   Vaccine counseling 08/18/2015   Screening for condition 08/18/2015   Screen for STD (sexually transmitted disease) 08/18/2015   Encounter for health maintenance examination in adult 08/18/2015   Routine general medical examination at a health care facility 10/29/2011   HTN (hypertension) 04/20/2011   Obesity (BMI 30-39.9) 04/20/2011   Past Medical History:  Diagnosis Date   Arthritis    H/O bilateral oophorectomy 1985   along with hysterectomy   H/O cardiac catheterization 2005   reportedly normal per patient   History of bone density study    2012, 2017 normal   History of UTI    Obesity    S/P hysterectomy 1995   Seasonal allergies    Shingles 2014   Wears glasses    Past Surgical History:  Procedure Laterality Date   CARDIAC CATHETERIZATION  1995   reportedly normal per patient.     COLONOSCOPY  2013   normal per patient   VAGINAL HYSTERECTOMY  1985   total, due to fibroids   WISDOM TOOTH EXTRACTION     Social History   Tobacco Use   Smoking status: Never   Smokeless tobacco: Never  Vaping Use   Vaping  status: Never Used  Substance Use Topics   Alcohol use: No   Drug use: No   Social History   Socioeconomic History   Marital status: Divorced    Spouse name: Not on file   Number of children: 1   Years of education: Not on file   Highest education level: Bachelor's degree (e.g., BA, AB, BS)  Occupational History   Not on file  Tobacco Use   Smoking status: Never   Smokeless tobacco: Never  Vaping Use   Vaping status: Never Used  Substance and Sexual Activity   Alcohol use: No   Drug use: No   Sexual activity: Yes    Partners: Male  Other Topics Concern   Not on file  Social History Narrative   She lives alone.  She has one grown son.   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      Was prior working as a Arts development officer as a Acupuncturist.     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   [employer]   Social Determinants of Health   Financial Resource Strain: Low Risk  (08/28/2022)   Overall Financial Resource Strain (CARDIA)    Difficulty of Paying Living Expenses:  Not hard at all  Food Insecurity: No Food Insecurity (01/30/2023)   Hunger Vital Sign    Worried About Running Out of Food in the Last Year: Never true    Ran Out of Food in the Last Year: Never true  Transportation Needs: No Transportation Needs (01/30/2023)   PRAPARE - Administrator, Civil Service (Medical): No    Lack of Transportation (Non-Medical): No  Physical Activity: Inactive (08/28/2022)   Exercise Vital Sign    Days of Exercise per Week: 0 days    Minutes of Exercise per Session: 10 min  Stress: No Stress Concern Present (08/28/2022)   [employer] of Occupational Health - Occupational Stress Questionnaire    Feeling of Stress : Only a little  Social Connections: Moderately Integrated (08/28/2022)   Social Connection and Isolation Panel [NHANES]    Frequency of Communication with Friends and Family: More than three  times a week    Frequency of Social Gatherings with Friends and Family: More than three times a week    Attends Religious Services: More than 4 times per year    Active Member of [employer] or Organizations: Yes    Attends Engineer, structural: More than 4 times per year    Marital Status: Divorced  Intimate Partner Violence: Not At Risk (01/30/2023)   Humiliation, Afraid, Rape, and Kick questionnaire    Fear of Current or Ex-Partner: No    Emotionally Abused: No    Physically Abused: No    Sexually Abused: No   Family Status  Relation Name Status   Father  Alive   Mother  Alive   Brother  Alive   Brother  Alive   MGM  (Not Specified)   MGF  (Not Specified)  No partnership data on file   Family History  Problem Relation Age of Onset   Hyperlipidemia Father        Living, 63   Cancer Father        prostate   Hypertension Father    Hyperlipidemia Mother        Living 97   Healthy Brother        x2   Cancer Maternal Grandmother        stomach   Stroke Maternal Grandfather    Allergies  Allergen Reactions   Alprazolam Hives   Sulfa Antibiotics Hives   Sulfa Drugs Cross Reactors Itching   Zithromax [Azithromycin]       Review of Systems  Constitutional:  Negative for chills and fever.  Skin:  Positive for rash.      Objective:     BP 118/76   Pulse (!) 104   Temp (!) 97.3 F (36.3 C)   Resp 18   Ht 5\' 5"  (1.651 m)   Wt 238 lb 6.4 oz (108.1 kg)   SpO2 96%   BMI 39.67 kg/m  BP Readings from Last 3 Encounters:  02/07/23 118/76  02/05/23 124/80  02/04/23 134/61   Wt Readings from Last 3 Encounters:  02/07/23 238 lb 6.4 oz (108.1 kg)  02/05/23 238 lb 12.8 oz (108.3 kg)  02/04/23 238 lb 14.4 oz (108.4 kg)      Physical Exam Constitutional:      Appearance: Normal appearance.  HENT:     Head: Normocephalic and atraumatic.  Eyes:     Conjunctiva/sclera: Conjunctivae normal.  Cardiovascular:     Rate and Rhythm: Normal rate and regular  rhythm.  Pulmonary:  Effort: Pulmonary effort is normal.     Breath sounds: Normal breath sounds.  Musculoskeletal:        General: Swelling present. No tenderness.  Skin:    General: Skin is warm and dry.     Comments: Erythema and warmth still present but much improved, drainage resolved  Neurological:     General: No focal deficit present.     Mental Status: She is alert. Mental status is at baseline.  Psychiatric:        Mood and Affect: Mood normal.        Behavior: Behavior normal.      No results found for any visits on 02/07/23.  Last CBC Lab Results  Component Value Date   WBC 8.0 02/01/2023   HGB 13.6 02/01/2023   HCT 41.8 02/01/2023   MCV 84.8 02/01/2023   MCH 27.6 02/01/2023   RDW 13.6 02/01/2023   PLT 342 02/01/2023   Last metabolic panel Lab Results  Component Value Date   GLUCOSE 115 (H) 02/01/2023   NA 137 02/01/2023   K 3.7 02/01/2023   CL 104 02/01/2023   CO2 23 02/01/2023   BUN 11 02/01/2023   CREATININE 0.99 02/01/2023   GFRNONAA >60 02/01/2023   CALCIUM 9.0 02/01/2023   PHOS 2.7 02/01/2023   PROT 7.1 01/31/2023   ALBUMIN 3.7 01/31/2023   LABGLOB 2.3 06/20/2018   AGRATIO 1.9 06/20/2018   BILITOT 1.1 01/31/2023   ALKPHOS 77 01/31/2023   AST 17 01/31/2023   ALT 13 01/31/2023   ANIONGAP 10 02/01/2023   Last lipids Lab Results  Component Value Date   CHOL 229 (H) 05/24/2021   HDL 81 05/24/2021   LDLCALC 125 (H) 05/24/2021   LDLDIRECT 120.7 10/19/2011   TRIG 122 05/24/2021   CHOLHDL 2.8 05/24/2021   Last hemoglobin A1c Lab Results  Component Value Date   HGBA1C 5.4 08/28/2022   Last thyroid functions Lab Results  Component Value Date   TSH 1.763 02/25/2014   Last vitamin D No results found for: "25OHVITD2", "25OHVITD3", "VD25OH" Last vitamin B12 and Folate No results found for: "VITAMINB12", "FOLATE"    The 10-year ASCVD risk score (Arnett DK, et al., 2019) is: 14.4%    Assessment & Plan:   1. Cellulitis of left  upper extremity/Thrombophlebitis arm: Improving.  Culture showing Staph aureus, most likely MRSA.  Continue cefdinir and doxycycline until next week.  She will take her last dose of prednisone tomorrow.  Swelling improved, continue he, elevation and compression.   Return for already scheduled .    Margarita Mail, DO

## 2023-02-08 LAB — WOUND CULTURE
MICRO NUMBER:: 15597490
SPECIMEN QUALITY:: ADEQUATE

## 2023-02-12 NOTE — Progress Notes (Unsigned)
Established Patient Office Visit  Subjective   Patient ID: Sherry Lowe, female    DOB: 01-26-1951  Age: 72 y.o. MRN: [id number]  No chief complaint on file.   HPI  Patient is here today for follow up on cellulitis and superficial thrombophlebitis.   Was treated with Rocephin IM and Cefdinir 300 mg BID x 7 days and Doxycycline 100 mg BID x 10 days and is now 3 days and to total course.  She was also prescribed prednisone 20 mg for 3 days to decrease inflammation and swelling.  Today she states she has better range of motion and is able to grip with her right hand.  Pain, redness and warmth still there but much improved.  No fevers.  Drainage has resolved.  Patient Active Problem List   Diagnosis Date Noted   Acute colitis 01/30/2023   Screen for colon cancer 12/25/2016   Vaccine refused by patient 12/20/2016   Vaccine counseling 08/18/2015   Screening for condition 08/18/2015   Screen for STD (sexually transmitted disease) 08/18/2015   Encounter for health maintenance examination in adult 08/18/2015   Routine general medical examination at a health care facility 10/29/2011   HTN (hypertension) 04/20/2011   Obesity (BMI 30-39.9) 04/20/2011   Past Medical History:  Diagnosis Date   Arthritis    H/O bilateral oophorectomy 1985   along with hysterectomy   H/O cardiac catheterization 2005   reportedly normal per patient   History of bone density study    2012, 2017 normal   History of UTI    Obesity    S/P hysterectomy 1995   Seasonal allergies    Shingles 2014   Wears glasses    Past Surgical History:  Procedure Laterality Date   CARDIAC CATHETERIZATION  1995   reportedly normal per patient.     COLONOSCOPY  2013   normal per patient   VAGINAL HYSTERECTOMY  1985   total, due to fibroids   WISDOM TOOTH EXTRACTION     Social History   Tobacco Use   Smoking status: Never   Smokeless tobacco: Never  Vaping Use   Vaping status: Never Used  Substance Use Topics    Alcohol use: No   Drug use: No   Social History   Socioeconomic History   Marital status: Divorced    Spouse name: Not on file   Number of children: 1   Years of education: Not on file   Highest education level: Bachelor's degree (e.g., BA, AB, BS)  Occupational History   Not on file  Tobacco Use   Smoking status: Never   Smokeless tobacco: Never  Vaping Use   Vaping status: Never Used  Substance and Sexual Activity   Alcohol use: No   Drug use: No   Sexual activity: Yes    Partners: Male  Other Topics Concern   Not on file  Social History Narrative   She lives alone.  She has one grown son.   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      Was prior working as a Arts development officer as a Acupuncturist.     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   [employer]   Social Determinants of Health   Financial Resource Strain: Low Risk  (08/28/2022)   Overall Financial Resource Strain (CARDIA)    Difficulty of Paying Living Expenses: Not hard at all  Food Insecurity: No Food  Insecurity (01/30/2023)   Hunger Vital Sign    Worried About Running Out of Food in the Last Year: Never true    Ran Out of Food in the Last Year: Never true  Transportation Needs: No Transportation Needs (01/30/2023)   PRAPARE - Administrator, Civil Service (Medical): No    Lack of Transportation (Non-Medical): No  Physical Activity: Inactive (08/28/2022)   Exercise Vital Sign    Days of Exercise per Week: 0 days    Minutes of Exercise per Session: 10 min  Stress: No Stress Concern Present (08/28/2022)   [employer] of Occupational Health - Occupational Stress Questionnaire    Feeling of Stress : Only a little  Social Connections: Moderately Integrated (08/28/2022)   Social Connection and Isolation Panel [NHANES]    Frequency of Communication with Friends and Family: More than three times a week    Frequency of Social  Gatherings with Friends and Family: More than three times a week    Attends Religious Services: More than 4 times per year    Active Member of [employer] or Organizations: Yes    Attends Engineer, structural: More than 4 times per year    Marital Status: Divorced  Intimate Partner Violence: Not At Risk (01/30/2023)   Humiliation, Afraid, Rape, and Kick questionnaire    Fear of Current or Ex-Partner: No    Emotionally Abused: No    Physically Abused: No    Sexually Abused: No   Family Status  Relation Name Status   Father  Alive   Mother  Alive   Brother  Alive   Brother  Alive   MGM  (Not Specified)   MGF  (Not Specified)  No partnership data on file   Family History  Problem Relation Age of Onset   Hyperlipidemia Father        Living, 70   Cancer Father        prostate   Hypertension Father    Hyperlipidemia Mother        Living 52   Healthy Brother        x2   Cancer Maternal Grandmother        stomach   Stroke Maternal Grandfather    Allergies  Allergen Reactions   Alprazolam Hives   Sulfa Antibiotics Hives   Sulfa Drugs Cross Reactors Itching   Zithromax [Azithromycin]       Review of Systems  Constitutional:  Negative for chills and fever.  Skin:  Positive for rash.      Objective:     There were no vitals taken for this visit. BP Readings from Last 3 Encounters:  02/07/23 118/76  02/05/23 124/80  02/04/23 134/61   Wt Readings from Last 3 Encounters:  02/07/23 238 lb 6.4 oz (108.1 kg)  02/05/23 238 lb 12.8 oz (108.3 kg)  02/04/23 238 lb 14.4 oz (108.4 kg)      Physical Exam Constitutional:      Appearance: Normal appearance.  HENT:     Head: Normocephalic and atraumatic.  Eyes:     Conjunctiva/sclera: Conjunctivae normal.  Cardiovascular:     Rate and Rhythm: Normal rate and regular rhythm.  Pulmonary:     Effort: Pulmonary effort is normal.     Breath sounds: Normal breath sounds.  Musculoskeletal:        General: Swelling  present. No tenderness.  Skin:    General: Skin is warm and dry.     Comments: Erythema and  warmth still present but much improved, drainage resolved  Neurological:     General: No focal deficit present.     Mental Status: She is alert. Mental status is at baseline.  Psychiatric:        Mood and Affect: Mood normal.        Behavior: Behavior normal.      No results found for any visits on 02/13/23.  Last CBC Lab Results  Component Value Date   WBC 8.0 02/01/2023   HGB 13.6 02/01/2023   HCT 41.8 02/01/2023   MCV 84.8 02/01/2023   MCH 27.6 02/01/2023   RDW 13.6 02/01/2023   PLT 342 02/01/2023   Last metabolic panel Lab Results  Component Value Date   GLUCOSE 115 (H) 02/01/2023   NA 137 02/01/2023   K 3.7 02/01/2023   CL 104 02/01/2023   CO2 23 02/01/2023   BUN 11 02/01/2023   CREATININE 0.99 02/01/2023   GFRNONAA >60 02/01/2023   CALCIUM 9.0 02/01/2023   PHOS 2.7 02/01/2023   PROT 7.1 01/31/2023   ALBUMIN 3.7 01/31/2023   LABGLOB 2.3 06/20/2018   AGRATIO 1.9 06/20/2018   BILITOT 1.1 01/31/2023   ALKPHOS 77 01/31/2023   AST 17 01/31/2023   ALT 13 01/31/2023   ANIONGAP 10 02/01/2023   Last lipids Lab Results  Component Value Date   CHOL 229 (H) 05/24/2021   HDL 81 05/24/2021   LDLCALC 125 (H) 05/24/2021   LDLDIRECT 120.7 10/19/2011   TRIG 122 05/24/2021   CHOLHDL 2.8 05/24/2021   Last hemoglobin A1c Lab Results  Component Value Date   HGBA1C 5.4 08/28/2022   Last thyroid functions Lab Results  Component Value Date   TSH 1.763 02/25/2014   Last vitamin D No results found for: "25OHVITD2", "25OHVITD3", "VD25OH" Last vitamin B12 and Folate No results found for: "VITAMINB12", "FOLATE"    The 10-year ASCVD risk score (Arnett DK, et al., 2019) is: 14.4%    Assessment & Plan:   1. Cellulitis of left upper extremity/Thrombophlebitis arm: Improving.  Culture showing Staph aureus, most likely MRSA.  Continue cefdinir and doxycycline until next week.   She will take her last dose of prednisone tomorrow.  Swelling improved, continue he, elevation and compression.   No follow-ups on file.    Margarita Mail, DO

## 2023-02-13 ENCOUNTER — Ambulatory Visit (INDEPENDENT_AMBULATORY_CARE_PROVIDER_SITE_OTHER): Payer: BC Managed Care – PPO | Admitting: Internal Medicine

## 2023-02-13 ENCOUNTER — Encounter: Payer: Self-pay | Admitting: Internal Medicine

## 2023-02-13 VITALS — BP 130/82 | HR 87 | Temp 97.8°F | Resp 18 | Ht 65.0 in | Wt 236.0 lb

## 2023-02-13 DIAGNOSIS — I808 Phlebitis and thrombophlebitis of other sites: Secondary | ICD-10-CM | POA: Diagnosis not present

## 2023-02-13 DIAGNOSIS — L03114 Cellulitis of left upper limb: Secondary | ICD-10-CM | POA: Diagnosis not present

## 2023-02-27 NOTE — Progress Notes (Unsigned)
Name: Sherry Lowe   MRN: [id number]    DOB: January 23, 1951   Date:02/28/2023       Progress Note  Subjective  Chief Complaint  Chief Complaint  Patient presents with   Follow-up    HPI  Patient presents for annual CPE.  Diet: eats vegetables and fruits, trying to decrease fried foods, meats [facility]  Last Eye Exam: UTD Last Dental Exam: UTD  Flowsheet Row Clinical Support from 07/13/2021 in [facility]  AUDIT-C Score 0      Depression: Phq 9 is  negative    02/28/2023    8:07 AM 02/05/2023    2:55 PM 02/04/2023   11:04 AM 09/25/2022    1:00 PM 08/28/2022   11:20 AM  Depression screen PHQ 2/9  Decreased Interest 0 0 0 0 0  Down, Depressed, Hopeless 0 0 0 0 0  PHQ - 2 Score 0 0 0 0 0  Altered sleeping 0 0 1 0 0  Tired, decreased energy 0 0 1 0 0  Change in appetite 0 0 1 0 0  Feeling bad or failure about yourself  0 0 0 0 0  Trouble concentrating 0 0 0 0 0  Moving slowly or fidgety/restless 0 0 0 0 0  Suicidal thoughts 0 0 0 0 0  PHQ-9 Score 0 0 3 0 0  Difficult doing work/chores Not difficult at all Not difficult at all Not difficult at all Not difficult at all Not difficult at all   Hypertension: BP Readings from Last 3 Encounters:  02/28/23 128/84  02/13/23 130/82  02/07/23 118/76   Obesity: Wt Readings from Last 3 Encounters:  02/28/23 237 lb (107.5 kg)  02/13/23 236 lb (107 kg)  02/07/23 238 lb 6.4 oz (108.1 kg)   BMI Readings from Last 3 Encounters:  02/28/23 39.44 kg/m  02/13/23 39.27 kg/m  02/07/23 39.67 kg/m     Vaccines:  HPV: n/a Tdap: UTD 2015 Shingrix: Discussed obtaining at pharmacy  Pneumonia: Due today - patient wants to think about it Flu: Due today COVID-19: 3 vaccines   Hep C Screening: 2015 Intimate partner violence: negative screen  LMP: hysterectomy 30 years ago because of fibroids   Discussed importance of follow up if any post-menopausal bleeding:  yes  Incontinence Symptoms: negative for symptoms   Breast cancer:  - Last Mammogram: 11/23  Osteoporosis Prevention : Discussed high calcium and vitamin D supplementation, weight bearing exercises Bone density :Last DEXA in 2017 - normal   Cervical cancer screening: n/a  Skin cancer: Discussed monitoring for atypical lesions  Colorectal cancer: Cologuard negative 2/23   Lung cancer:  Low Dose CT Chest recommended if Age 29-80 years, 20 pack-year currently smoking OR have quit w/in 15years. Patient does not qualify for screen   ECG: 01/30/23  Advanced Care Planning: A voluntary discussion about advance care planning including the explanation and discussion of advance directives.  Patient does have a living will and power of attorney of health care   Lipids: Lab Results  Component Value Date   CHOL 229 (H) 05/24/2021   CHOL 233 (H) 06/20/2018   CHOL 217 (H) 08/18/2015   Lab Results  Component Value Date   HDL 81 05/24/2021   HDL 83 06/20/2018   HDL 89 08/18/2015   Lab Results  Component Value Date   LDLCALC 125 (H) 05/24/2021   LDLCALC 135 (H) 06/20/2018   LDLCALC 115 08/18/2015  Lab Results  Component Value Date   TRIG 122 05/24/2021   TRIG 74 06/20/2018   TRIG 63 08/18/2015   Lab Results  Component Value Date   CHOLHDL 2.8 05/24/2021   CHOLHDL 2.8 06/20/2018   CHOLHDL 2.4 08/18/2015   Lab Results  Component Value Date   LDLDIRECT 120.7 10/19/2011    Glucose: Glucose, Bld  Date Value Ref Range Status  02/01/2023 115 (H) 70 - 99 mg/dL Final    Comment:    Glucose reference range applies only to samples taken after fasting for at least 8 hours.  01/31/2023 118 (H) 70 - 99 mg/dL Final    Comment:    Glucose reference range applies only to samples taken after fasting for at least 8 hours.  01/30/2023 132 (H) 70 - 99 mg/dL Final    Comment:    Glucose reference range applies only to samples taken after fasting for at least 8 hours.    Patient Active  Problem List   Diagnosis Date Noted   Acute colitis 01/30/2023   Screen for colon cancer 12/25/2016   Vaccine refused by patient 12/20/2016   Vaccine counseling 08/18/2015   Screening for condition 08/18/2015   Screen for STD (sexually transmitted disease) 08/18/2015   Encounter for health maintenance examination in adult 08/18/2015   Routine general medical examination at a health care facility 10/29/2011   HTN (hypertension) 04/20/2011   Obesity (BMI 30-39.9) 04/20/2011    Past Surgical History:  Procedure Laterality Date   CARDIAC CATHETERIZATION  1995   reportedly normal per patient.     COLONOSCOPY  2013   normal per patient   VAGINAL HYSTERECTOMY  1985   total, due to fibroids   WISDOM TOOTH EXTRACTION      Family History  Problem Relation Age of Onset   Hyperlipidemia Father        Living, 38   Cancer Father        prostate   Hypertension Father    Hyperlipidemia Mother        Living 73   Healthy Brother        x2   Cancer Maternal Grandmother        stomach   Stroke Maternal Grandfather     Social History   Socioeconomic History   Marital status: Divorced    Spouse name: Not on file   Number of children: 1   Years of education: Not on file   Highest education level: Bachelor's degree (e.g., BA, AB, BS)  Occupational History   Not on file  Tobacco Use   Smoking status: Never   Smokeless tobacco: Never  Vaping Use   Vaping status: Never Used  Substance and Sexual Activity   Alcohol use: No   Drug use: No   Sexual activity: Yes    Partners: Male  Other Topics Concern   Not on file  Social History Narrative   She lives alone.  She has one grown son.   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      Was prior working as a Arts development officer as a Acupuncturist.     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   [employer]   Social Determinants of Health   Financial Resource Strain:  Low Risk  (08/28/2022)   Overall Financial Resource Strain (CARDIA)    Difficulty of Paying Living Expenses: Not hard at all  Food Insecurity:  No Food Insecurity (01/30/2023)   Hunger Vital Sign    Worried About Running Out of Food in the Last Year: Never true    Ran Out of Food in the Last Year: Never true  Transportation Needs: No Transportation Needs (01/30/2023)   PRAPARE - Administrator, Civil Service (Medical): No    Lack of Transportation (Non-Medical): No  Physical Activity: Inactive (08/28/2022)   Exercise Vital Sign    Days of Exercise per Week: 0 days    Minutes of Exercise per Session: 10 min  Stress: No Stress Concern Present (08/28/2022)   [employer] of Occupational Health - Occupational Stress Questionnaire    Feeling of Stress : Only a little  Social Connections: Moderately Integrated (08/28/2022)   Social Connection and Isolation Panel [NHANES]    Frequency of Communication with Friends and Family: More than three times a week    Frequency of Social Gatherings with Friends and Family: More than three times a week    Attends Religious Services: More than 4 times per year    Active Member of [employer] or Organizations: Yes    Attends Banker Meetings: More than 4 times per year    Marital Status: Divorced  Intimate Partner Violence: Not At Risk (01/30/2023)   Humiliation, Afraid, Rape, and Kick questionnaire    Fear of Current or Ex-Partner: No    Emotionally Abused: No    Physically Abused: No    Sexually Abused: No    No current outpatient medications on file.  Allergies  Allergen Reactions   Alprazolam Hives   Sulfa Antibiotics Hives   Sulfa Drugs Cross Reactors Itching   Zithromax [Azithromycin]      Review of Systems  All other systems reviewed and are negative.   Objective  Vitals:   02/28/23 0803  BP: 128/84  Pulse: 88  Resp: 18  Temp: 98.1 F (36.7 C)  SpO2: 98%  Weight: 237 lb (107.5 kg)  Height: 5\' 5"  (1.651 m)     Body mass index is 39.44 kg/m.  Physical Exam Constitutional:      Appearance: Normal appearance.  HENT:     Head: Normocephalic and atraumatic.     Mouth/Throat:     Mouth: Mucous membranes are moist.     Pharynx: Oropharynx is clear.  Eyes:     Extraocular Movements: Extraocular movements intact.     Conjunctiva/sclera: Conjunctivae normal.     Pupils: Pupils are equal, round, and reactive to light.  Neck:     Comments: No thyromegaly  Cardiovascular:     Rate and Rhythm: Normal rate and regular rhythm.  Pulmonary:     Effort: Pulmonary effort is normal.     Breath sounds: Normal breath sounds.  Musculoskeletal:     Cervical back: No tenderness.     Right lower leg: No edema.     Left lower leg: No edema.  Lymphadenopathy:     Cervical: No cervical adenopathy.  Skin:    General: Skin is warm and dry.  Neurological:     General: No focal deficit present.     Mental Status: She is alert. Mental status is at baseline.  Psychiatric:        Mood and Affect: Mood normal.        Behavior: Behavior normal.     Recent Results (from the past 2160 hour(s))  Basic metabolic panel     Status: Abnormal   Collection Time: 01/30/23 12:35 AM  Result  Value Ref Range   Sodium 139 135 - 145 mmol/L   Potassium 4.2 3.5 - 5.1 mmol/L   Chloride 103 98 - 111 mmol/L   CO2 21 (L) 22 - 32 mmol/L   Glucose, Bld 132 (H) 70 - 99 mg/dL    Comment: Glucose reference range applies only to samples taken after fasting for at least 8 hours.   BUN 14 8 - 23 mg/dL   Creatinine, Ser 8.29 (H) 0.44 - 1.00 mg/dL   Calcium 56.2 8.9 - 13.0 mg/dL   GFR, Estimated 56 (L) >60 mL/min    Comment: (NOTE) Calculated using the CKD-EPI Creatinine Equation (2021)    Anion gap 15 5 - 15    Comment: Performed at [facility], [address], [place], [place] [address]  CBC     Status: None   Collection Time: 01/30/23 12:35 AM  Result Value Ref Range   WBC 6.0 4.0 - 10.5 K/uL   RBC 4.80 3.87 - 5.11  MIL/uL   Hemoglobin 13.4 12.0 - 15.0 g/dL   HCT 46.9 62.9 - 52.8 %   MCV 86.5 80.0 - 100.0 fL   MCH 27.9 26.0 - 34.0 pg   MCHC 32.3 30.0 - 36.0 g/dL   RDW 41.3 24.4 - 01.0 %   Platelets 178 150 - 400 K/uL   nRBC 0.0 0.0 - 0.2 %    Comment: Performed at [facility], [address], [place], [place] [address]  Urinalysis, Routine w reflex microscopic -Urine, Clean Catch     Status: Abnormal   Collection Time: 01/30/23 12:35 AM  Result Value Ref Range   Color, Urine COLORLESS (A) YELLOW   APPearance CLEAR CLEAR   Specific Gravity, Urine 1.006 1.005 - 1.030   pH 8.0 5.0 - 8.0   Glucose, UA NEGATIVE NEGATIVE mg/dL   Hgb urine dipstick NEGATIVE NEGATIVE   Bilirubin Urine NEGATIVE NEGATIVE   Ketones, ur 5 (A) NEGATIVE mg/dL   Protein, ur NEGATIVE NEGATIVE mg/dL   Nitrite NEGATIVE NEGATIVE   Leukocytes,Ua NEGATIVE NEGATIVE    Comment: Performed at [facility], [address], [place], [place] [address]  Respiratory (~20 pathogens) panel by PCR     Status: None   Collection Time: 01/30/23  8:35 PM   Specimen: Nasopharyngeal Swab; Respiratory  Result Value Ref Range   Adenovirus NOT DETECTED NOT DETECTED   Coronavirus 229E NOT DETECTED NOT DETECTED    Comment: (NOTE) The Coronavirus on the Respiratory Panel, DOES NOT test for the novel  Coronavirus (2019 nCoV)    Coronavirus HKU1 NOT DETECTED NOT DETECTED   Coronavirus NL63 NOT DETECTED NOT DETECTED   Coronavirus OC43 NOT DETECTED NOT DETECTED   Metapneumovirus NOT DETECTED NOT DETECTED   Rhinovirus / Enterovirus NOT DETECTED NOT DETECTED   Influenza A NOT DETECTED NOT DETECTED   Influenza B NOT DETECTED NOT DETECTED   Parainfluenza Virus 1 NOT DETECTED NOT DETECTED   Parainfluenza Virus 2 NOT DETECTED NOT DETECTED   Parainfluenza Virus 3 NOT DETECTED NOT DETECTED   Parainfluenza Virus 4 NOT DETECTED NOT DETECTED   Respiratory Syncytial Virus NOT DETECTED NOT DETECTED   Bordetella pertussis NOT DETECTED NOT DETECTED    Bordetella Parapertussis NOT DETECTED NOT DETECTED   Chlamydophila pneumoniae NOT DETECTED NOT DETECTED   Mycoplasma pneumoniae NOT DETECTED NOT DETECTED    Comment: Performed at [facility], [address] [place], [place] [address]  CBC     Status: None  Collection Time: 01/31/23  5:56 AM  Result Value Ref Range   WBC 10.1 4.0 - 10.5 K/uL   RBC 4.72 3.87 - 5.11 MIL/uL   Hemoglobin 13.3 12.0 - 15.0 g/dL   HCT 16.1 09.6 - 04.5 %   MCV 82.8 80.0 - 100.0 fL   MCH 28.2 26.0 - 34.0 pg   MCHC 34.0 30.0 - 36.0 g/dL   RDW 40.9 81.1 - 91.4 %   Platelets 357 150 - 400 K/uL   nRBC 0.0 0.0 - 0.2 %    Comment: Performed at [facility], [address] [place], [place] [address]  Comprehensive metabolic panel     Status: Abnormal   Collection Time: 01/31/23  5:56 AM  Result Value Ref Range   Sodium 134 (L) 135 - 145 mmol/L   Potassium 3.7 3.5 - 5.1 mmol/L   Chloride 103 98 - 111 mmol/L   CO2 21 (L) 22 - 32 mmol/L   Glucose, Bld 118 (H) 70 - 99 mg/dL    Comment: Glucose reference range applies only to samples taken after fasting for at least 8 hours.   BUN 9 8 - 23 mg/dL   Creatinine, Ser 6.21 0.44 - 1.00 mg/dL   Calcium 9.0 8.9 - 30.8 mg/dL   Total Protein 7.1 6.5 - 8.1 g/dL   Albumin 3.7 3.5 - 5.0 g/dL   AST 17 15 - 41 U/L   ALT 13 0 - 44 U/L   Alkaline Phosphatase 77 38 - 126 U/L   Total Bilirubin 1.1 0.3 - 1.2 mg/dL   GFR, Estimated >65 >78 mL/min    Comment: (NOTE) Calculated using the CKD-EPI Creatinine Equation (2021)    Anion gap 10 5 - 15    Comment: Performed at [facility], [address] [place], [place] [address]  Magnesium     Status: None   Collection Time: 01/31/23  5:56 AM  Result Value Ref Range   Magnesium 1.8 1.7 - 2.4 mg/dL    Comment: Performed at [facility], [address] [place], [place] [address]  Phosphorus     Status: None   Collection Time: 01/31/23  5:56 AM  Result Value Ref Range   Phosphorus 2.8 2.5 -  4.6 mg/dL    Comment: Performed at [facility], [address] [place], [place] [address]  CBC     Status: None   Collection Time: 02/01/23  6:12 AM  Result Value Ref Range   WBC 8.0 4.0 - 10.5 K/uL   RBC 4.93 3.87 - 5.11 MIL/uL   Hemoglobin 13.6 12.0 - 15.0 g/dL   HCT 01.0 27.2 - 53.6 %   MCV 84.8 80.0 - 100.0 fL   MCH 27.6 26.0 - 34.0 pg   MCHC 32.5 30.0 - 36.0 g/dL   RDW 64.4 03.4 - 74.2 %   Platelets 342 150 - 400 K/uL   nRBC 0.0 0.0 - 0.2 %    Comment: Performed at [facility], [address] [place], [place] [address]  Basic metabolic panel     Status: Abnormal   Collection Time: 02/01/23  6:12 AM  Result Value Ref Range   Sodium 137 135 - 145 mmol/L   Potassium 3.7 3.5 - 5.1 mmol/L   Chloride 104 98 - 111 mmol/L   CO2 23 22 - 32 mmol/L   Glucose, Bld 115 (H) 70 - 99 mg/dL    Comment: Glucose reference range applies only to samples taken after fasting  for at least 8 hours.   BUN 11 8 - 23 mg/dL   Creatinine, Ser 1.61 0.44 - 1.00 mg/dL   Calcium 9.0 8.9 - 09.6 mg/dL   GFR, Estimated >04 >54 mL/min    Comment: (NOTE) Calculated using the CKD-EPI Creatinine Equation (2021)    Anion gap 10 5 - 15    Comment: Performed at [facility], [address] [place], [place] [address]  Phosphorus     Status: None   Collection Time: 02/01/23  6:12 AM  Result Value Ref Range   Phosphorus 2.7 2.5 - 4.6 mg/dL    Comment: Performed at [facility], [address] [place], [place] [address]  Magnesium     Status: None   Collection Time: 02/01/23  6:12 AM  Result Value Ref Range   Magnesium 1.9 1.7 - 2.4 mg/dL    Comment: Performed at [facility], [address] [place], [place] [address]  Wound culture     Status: Abnormal   Collection Time: 02/05/23  3:51 PM   Specimen: Wound  Result Value Ref Range   MICRO NUMBER: [id number]    SPECIMEN QUALITY: Adequate    SOURCE: WOUND (SITE NOT SPECIFIED)    STATUS: FINAL    GRAM  STAIN:      No epithelial cells seen No white blood cells seen No organisms seen   ISOLATE 1: Staphylococcus aureus (A)     Comment: Scant growth of Staphylococcus aureus      Susceptibility   Staphylococcus aureus - AEROBIC CULT, GRAM STAIN POSITIVE 1    VANCOMYCIN <=0.5 Sensitive     CIPROFLOXACIN <=0.5 Sensitive     CLINDAMYCIN <=0.25 Sensitive     LEVOFLOXACIN <=0.12 Sensitive     ERYTHROMYCIN <=0.25 Sensitive     GENTAMICIN <=0.5 Sensitive     OXACILLIN* 0.5 Sensitive      * Oxacillin susceptible staphylococci are susceptible to other penicillinase-stable penicillins (e.g., methicillin, nafcillin), beta- lactam/beta-lactamase inhibitor combinations, and cephems with staphylococcal indications, including cefazolin.     TETRACYCLINE <=1 Sensitive     TRIMETH/SULFA* <=10 Sensitive      * Oxacillin susceptible staphylococci are susceptible to other penicillinase-stable penicillins (e.g., methicillin, nafcillin), beta- lactam/beta-lactamase inhibitor combinations, and cephems with staphylococcal indications, including cefazolin. Legend: S = Susceptible  I = Intermediate R = Resistant  NS = Not susceptible SDD = Susceptible Dose Dependent * = Not Tested  NR = Not Reported **NN = See Therapy Comments      Fall Risk:    02/28/2023    8:07 AM 02/04/2023   11:04 AM 09/25/2022    1:00 PM 08/28/2022   11:12 AM 07/18/2022    9:37 AM  Fall Risk   Falls in the past year? 0 0 0 0 0  Number falls in past yr: 0 0 0 0 0  Injury with Fall? 0 0 0 0 0  Risk for fall due to :  No Fall Risks     Follow up  Falls evaluation completed       Functional Status Survey: Is the patient deaf or have difficulty hearing?: No Does the patient have difficulty seeing, even when wearing glasses/contacts?: No Does the patient have difficulty concentrating, remembering, or making decisions?: No Does the patient have difficulty walking or climbing stairs?: No Does the patient have difficulty  dressing or bathing?: No Does the patient have difficulty doing errands alone such as visiting a doctor's office or shopping?: No   Assessment &  Plan  1. Annual physical exam/Encounter for screening mammogram for malignant neoplasm of breast: Physical exam completed, health maintenance reviewed.  - MM 3D SCREENING MAMMOGRAM BILATERAL BREAST; Future  -USPSTF grade A and B recommendations reviewed with patient; age-appropriate recommendations, preventive care, screening tests, etc discussed and encouraged; healthy living encouraged; see AVS for patient education given to patient -Discussed importance of 150 minutes of physical activity weekly, eat two servings of fish weekly, eat one serving of tree nuts ( cashews, pistachios, pecans, almonds.Marland Kitchen) every other day, eat 6 servings of fruit/vegetables daily and drink plenty of water and avoid sweet beverages.   -Reviewed Health Maintenance: Yes.

## 2023-02-28 ENCOUNTER — Ambulatory Visit (INDEPENDENT_AMBULATORY_CARE_PROVIDER_SITE_OTHER): Payer: BC Managed Care – PPO | Admitting: Internal Medicine

## 2023-02-28 ENCOUNTER — Encounter: Payer: Self-pay | Admitting: Internal Medicine

## 2023-02-28 VITALS — BP 128/84 | HR 88 | Temp 98.1°F | Resp 18 | Ht 65.0 in | Wt 237.0 lb

## 2023-02-28 DIAGNOSIS — Z1231 Encounter for screening mammogram for malignant neoplasm of breast: Secondary | ICD-10-CM | POA: Diagnosis not present

## 2023-02-28 DIAGNOSIS — Z0000 Encounter for general adult medical examination without abnormal findings: Secondary | ICD-10-CM

## 2023-02-28 NOTE — Patient Instructions (Addendum)
It was great seeing you today!  Plan discussed at today's visit: -Please call to schedule mammogram -Consider pneumonia vaccine   Follow up in: 1 year  Take care and let us know if you have any questions or concerns prior to your next visit.  Dr. Caralee Ates  Pneumococcal Conjugate Vaccine (PCV20) Injection What is this medication? PNEUMOCOCCAL CONJUGATE VACCINE (NEU mo KOK al kon ju gate vak SEEN) reduces the risk of pneumococcal disease, such as pneumonia. It does not treat pneumococcal disease. It is still possible to get pneumococcal disease after receiving this vaccine, but the symptoms may be less severe or not last as long. It works by helping your immune system learn how to fight off a future infection. This medicine may be used for other purposes; ask your health care provider or pharmacist if you have questions. COMMON BRAND NAME(S): Prevnar 20 What should I tell my care team before I take this medication? They need to know if you have any of these conditions: Bleeding disorder Fever Immune system problems An unusual or allergic reaction to pneumococcal vaccine, diphtheria toxoid, other vaccines, other medications, foods, dyes, or preservatives Pregnant or trying to get pregnant Breastfeeding How should I use this medication? This vaccine is injected into a muscle. It is given by your care team. A copy of Vaccine Information Statements will be given before each vaccination. Be sure to read this information carefully each time. This sheet may change often. Talk to your care team about the use of this medication in children. While it may be given to children as young as 6 weeks for selected conditions, precautions do apply. Overdosage: If you think you have taken too much of this medicine contact a poison control center or emergency room at once. NOTE: This medicine is only for you. Do not share this medicine with others. What if I miss a dose? This does not apply. This medication  is not for regular use. What may interact with this medication? Medications for cancer chemotherapy Medications that suppress your immune function Steroid medications, such as prednisone or cortisone This list may not describe all possible interactions. Give your health care provider a list of all the medicines, herbs, non-prescription drugs, or dietary supplements you use. Also tell them if you smoke, drink alcohol, or use illegal drugs. Some items may interact with your medicine. What should I watch for while using this medication? Visit your care team regularly. Report any side effects to your care team right away. This vaccine, like all vaccines, may not fully protect everyone. What side effects may I notice from receiving this medication? Side effects that you should report to your care team as soon as possible: Allergic reactions--skin rash, itching, hives, swelling of the face, lips, tongue, or throat Side effects that usually do not require medical attention (report these to your care team if they continue or are bothersome): Fatigue Fever Headache Joint pain Muscle pain Pain, redness, or irritation at injection site This list may not describe all possible side effects. Call your doctor for medical advice about side effects. You may report side effects to FDA at 1-800-FDA-1088. Where should I keep my medication? This vaccine is only given by your care team. It will not be stored at home. NOTE: This sheet is a summary. It may not cover all possible information. If you have questions about this medicine, talk to your doctor, pharmacist, or health care provider.  2024 Elsevier/Gold Standard (2021-09-20 00:00:00)

## 2023-03-20 ENCOUNTER — Ambulatory Visit: Payer: BC Managed Care – PPO

## 2023-04-19 ENCOUNTER — Ambulatory Visit: Payer: BC Managed Care – PPO

## 2023-04-19 NOTE — Progress Notes (Signed)
       CROSS COVER NOTE  NAME: Sherry Lowe MRN: [id number] DOB : 16-Apr-1951 ATTENDING PHYSICIAN: No att. providers found    Date of Service   02/01/2023  HPI/Events of Note   Patient wih fever, no leukocytosis  Interventions   Assessment/Plan: Respiratory panel r/o viral illness      Donnie Mesa NP [facility] Cross Cover 7pm-7am - check amion for availability Pager [phone]

## 2023-05-09 ENCOUNTER — Ambulatory Visit
Admission: RE | Admit: 2023-05-09 | Discharge: 2023-05-09 | Disposition: A | Discharge status: Home / Self Care | Payer: 59 | Source: Ambulatory Visit | Attending: Internal Medicine | Admitting: Internal Medicine

## 2023-05-09 DIAGNOSIS — Z0000 Encounter for general adult medical examination without abnormal findings: Secondary | ICD-10-CM

## 2023-05-09 DIAGNOSIS — Z1231 Encounter for screening mammogram for malignant neoplasm of breast: Secondary | ICD-10-CM

## 2023-07-10 ENCOUNTER — Ambulatory Visit: Payer: Self-pay | Admitting: Internal Medicine

## 2023-07-10 NOTE — Telephone Encounter (Signed)
 Chief Complaint: Boil on left shoulder Symptoms: Pain (5/10) Frequency: Intermittent Pertinent Negatives: Patient denies fever, itching, rash, weakness  Disposition: [x] Appointment(In office)  Additional Notes: Pt states she noticed a boil on Sunday morning on left shoulder. Pt has been putting heat and neosporin on it. Pt states it has decreased in size and pain. Pt scheduled for an appointment with PCP tomorrow morning. This RN educated pt on home care, new-worsening symptoms, when to call back/seek emergent care. Pt verbalized understanding and agrees to plan.      Copied from CRM [id number]. Topic: Clinical - Pink Word Triage >> Jul 10, 2023  3:16 PM DeAngela L wrote: Reason for Triage:  Patient believes she was bit or she possible has a boil and she noticed this on Sunday morning and its on her left shoulder Patient would like call back [phone] Reason for Disposition  Boil > 1/2 inch across (> 12 mm; larger than a marble)  Answer Assessment - Initial Assessment Questions 1. APPEARANCE of BOIL: "What does the boil look like?"      Center is hard with 3 white spots around it 2. LOCATION: "Where is the boil located?"      Left shoulder 3. NUMBER: "How many boils are there?"      3  Protocols used: Boil (Skin Abscess)-A-AH

## 2023-07-11 ENCOUNTER — Encounter: Payer: Self-pay | Admitting: Internal Medicine

## 2023-07-11 ENCOUNTER — Other Ambulatory Visit: Payer: Self-pay

## 2023-07-11 ENCOUNTER — Ambulatory Visit (INDEPENDENT_AMBULATORY_CARE_PROVIDER_SITE_OTHER): Admitting: Internal Medicine

## 2023-07-11 VITALS — BP 128/82 | HR 82 | Temp 98.4°F | Resp 16 | Ht 65.0 in | Wt 242.2 lb

## 2023-07-11 DIAGNOSIS — L0291 Cutaneous abscess, unspecified: Secondary | ICD-10-CM

## 2023-07-11 MED ORDER — DOXYCYCLINE HYCLATE 100 MG PO TABS
100.0000 mg | ORAL_TABLET | Freq: Two times a day (BID) | ORAL | 0 refills | Status: AC
Start: 2023-07-11 — End: 2023-07-18

## 2023-07-11 NOTE — Progress Notes (Signed)
   Acute Office Visit  Subjective:     Patient ID: Sherry Lowe, female    DOB: Aug 25, 1950, 73 y.o.   MRN: [id number]  Chief Complaint  Patient presents with   Recurrent Skin Infections    On left shoulder for 5 days, Red, painful    HPI Patient is in today for abscess on the left shoulder. She first noticed it on Sunday, felt like a tender spot. Does feel like its slowly getting smaller, was using warm compressed and was able to get a head on it. Minimal purulent drainage. No fevers or any other skin changes. Thinks it started as an insect bite.   Review of Systems  Constitutional:  Negative for chills and fever.  Skin:  Positive for rash.        Objective:    BP 128/82 (Cuff Size: Large)   Pulse 82   Temp 98.4 F (36.9 C) (Oral)   Resp 16   Ht 5\' 5"  (1.651 m)   Wt 242 lb 3.2 oz (109.9 kg)   SpO2 95%   BMI 40.30 kg/m    Physical Exam Constitutional:      Appearance: Normal appearance.  HENT:     Head: Normocephalic and atraumatic.  Eyes:     Conjunctiva/sclera: Conjunctivae normal.  Cardiovascular:     Rate and Rhythm: Normal rate and regular rhythm.  Pulmonary:     Effort: Pulmonary effort is normal.     Breath sounds: Normal breath sounds.  Skin:    General: Skin is warm and dry.     Comments: 2 inch abscess on left shoulder, was able to express some purulent fluid and blood  Neurological:     General: No focal deficit present.     Mental Status: She is alert. Mental status is at baseline.  Psychiatric:        Mood and Affect: Mood normal.        Behavior: Behavior normal.     No results found for any visits on 07/11/23.      Assessment & Plan:   1. Abscess (Primary): Manually drained, wound culture ordered. Continue warm compresses, topical antibiotics and will prescribe Doxycyline to take for 7 days.   - Wound culture - doxycycline (VIBRA-TABS) 100 MG tablet; Take 1 tablet (100 mg total) by mouth 2 (two) times daily for 7 days.  Dispense: 14  tablet; Refill: 0   Return for already scheduled.  Margarita Mail, DO

## 2023-07-14 LAB — WOUND CULTURE
MICRO NUMBER:: 16230942
SPECIMEN QUALITY:: ADEQUATE

## 2023-07-25 ENCOUNTER — Ambulatory Visit: Payer: Self-pay

## 2023-07-25 DIAGNOSIS — Z0000 Encounter for general adult medical examination without abnormal findings: Secondary | ICD-10-CM | POA: Diagnosis not present

## 2023-07-25 DIAGNOSIS — Z780 Asymptomatic menopausal state: Secondary | ICD-10-CM

## 2023-07-25 NOTE — Progress Notes (Signed)
 Subjective:   Sherry Lowe is a 73 y.o. who presents for a Medicare Wellness preventive visit.  Visit Complete: Virtual I connected with  Gareth Morgan on 07/25/23 by a audio enabled telemedicine application and verified that I am speaking with the correct person using two identifiers.  Patient Location: Home  Provider Location: Office/Clinic  I discussed the limitations of evaluation and management by telemedicine. The patient expressed understanding and agreed to proceed.  Vital Signs: Because this visit was a virtual/telehealth visit, some criteria may be missing or patient reported. Any vitals not documented were not able to be obtained and vitals that have been documented are patient reported.  VideoDeclined- This patient declined Librarian, academic. Therefore the visit was completed with audio only.  Persons Participating in Visit: Patient.  AWV Questionnaire: No: Patient Medicare AWV questionnaire was not completed prior to this visit.  Cardiac Risk Factors include: advanced age (>70men, >21 women);hypertension;sedentary lifestyle;obesity (BMI >30kg/m2)     Objective:    There were no vitals filed for this visit. There is no height or weight on file to calculate BMI.     07/25/2023    8:53 AM 01/30/2023    2:24 AM 01/30/2023   12:33 AM 07/19/2022    8:53 AM 07/13/2021   10:13 AM 04/04/2020    1:25 PM  Advanced Directives  Does Patient Have a Medical Advance Directive? No No No Yes Yes No  Type of Aeronautical engineer of [place];Living will Healthcare Power of [place];Living will   Copy of Healthcare Power of Attorney in Chart?    No - copy requested No - copy requested   Would patient like information on creating a medical advance directive? No - Patient declined No - Patient declined        Current Medications (verified) No outpatient encounter medications on file as of 07/25/2023.   No facility-administered encounter  medications on file as of 07/25/2023.    Allergies (verified) Alprazolam, Sulfa antibiotics, Sulfa drugs cross reactors, and Zithromax [azithromycin]   History: Past Medical History:  Diagnosis Date   Arthritis    H/O bilateral oophorectomy 1985   along with hysterectomy   H/O cardiac catheterization 2005   reportedly normal per patient   History of bone density study    2012, 2017 normal   History of UTI    Obesity    S/P hysterectomy 1995   Seasonal allergies    Shingles 2014   Wears glasses    Past Surgical History:  Procedure Laterality Date   CARDIAC CATHETERIZATION  1995   reportedly normal per patient.     COLONOSCOPY  2013   normal per patient   VAGINAL HYSTERECTOMY  1985   total, due to fibroids   WISDOM TOOTH EXTRACTION     Family History  Problem Relation Age of Onset   Hyperlipidemia Father        Living, 26   Cancer Father        prostate   Hypertension Father    Hyperlipidemia Mother        Living 45   Healthy Brother        x2   Cancer Maternal Grandmother        stomach   Stroke Maternal Grandfather    Social History   Socioeconomic History   Marital status: Divorced    Spouse name: Not on file   Number of children: 1   Years of education:  Not on file   Highest education level: Bachelor's degree (e.g., BA, AB, BS)  Occupational History   Not on file  Tobacco Use   Smoking status: Never   Smokeless tobacco: Never  Vaping Use   Vaping status: Never Used  Substance and Sexual Activity   Alcohol use: No   Drug use: No   Sexual activity: Yes    Partners: Male  Other Topics Concern   Not on file  Social History Narrative   She lives alone.  She has one grown son.   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      Was prior working as a Arts development officer as a Acupuncturist.     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   [employer]   Social Drivers of Health    Financial Resource Strain: Low Risk  (07/25/2023)   Overall Financial Resource Strain (CARDIA)    Difficulty of Paying Living Expenses: Not hard at all  Food Insecurity: No Food Insecurity (07/25/2023)   Hunger Vital Sign    Worried About Running Out of Food in the Last Year: Never true    Ran Out of Food in the Last Year: Never true  Transportation Needs: No Transportation Needs (07/25/2023)   PRAPARE - Administrator, Civil Service (Medical): No    Lack of Transportation (Non-Medical): No  Physical Activity: Insufficiently Active (07/25/2023)   Exercise Vital Sign    Days of Exercise per Week: 2 days    Minutes of Exercise per Session: 20 min  Stress: No Stress Concern Present (07/25/2023)   [employer] of Occupational Health - Occupational Stress Questionnaire    Feeling of Stress : Not at all  Social Connections: Moderately Integrated (07/25/2023)   Social Connection and Isolation Panel [NHANES]    Frequency of Communication with Friends and Family: More than three times a week    Frequency of Social Gatherings with Friends and Family: Twice a week    Attends Religious Services: More than 4 times per year    Active Member of [employer] or Organizations: Yes    Attends Engineer, structural: More than 4 times per year    Marital Status: Divorced    Tobacco Counseling Counseling given: Not Answered    Clinical Intake:  Pre-visit preparation completed: Yes  Pain : No/denies pain     BMI - recorded: 40.3 Nutritional Status: BMI > 30  Obese Nutritional Risks: None Diabetes: No  Lab Results  Component Value Date   HGBA1C 5.4 08/28/2022   HGBA1C 5.3 06/20/2018   HGBA1C 5.2 12/20/2016     How often do you need to have someone help you when you read instructions, pamphlets, or other written materials from your doctor or pharmacy?: 1 - Never  Interpreter Needed?: No  Information entered by :: Kennedy Bucker, LPN   Activities of Daily Living      07/25/2023    8:55 AM 02/28/2023    8:08 AM  In your present state of health, do you have any difficulty performing the following activities:  Hearing? 0 0  Vision? 0 0  Difficulty concentrating or making decisions? 0 0  Walking or climbing stairs? 1 0  Dressing or bathing? 0 0  Doing errands, shopping? 0 0  Preparing Food and eating ? N   Using the Toilet? N   In the past six months, have you accidently leaked urine?  N   Do you have problems with loss of bowel control? N   Managing your Medications? N   Managing your Finances? N   Housekeeping or managing your Housekeeping? N     Patient Care Team: Margarita Mail, DO as PCP - General (Internal Medicine) Dingeldein, Viviann Spare, MD (Ophthalmology)  Indicate any recent Medical Services you may have received from other than [facility] providers in the past year (date may be approximate).     Assessment:   This is a routine wellness examination for [facility].  Hearing/Vision screen Hearing Screening - Comments:: NO AIDS Vision Screening - Comments:: WEARS GLASSES ALL THE TIME- DR.DINGELDEIN   Goals Addressed             This Visit's Progress    DIET - EAT MORE FRUITS AND VEGETABLES         Depression Screen     07/25/2023    8:51 AM 02/28/2023    8:07 AM 02/05/2023    2:55 PM 02/04/2023   11:04 AM 09/25/2022    1:00 PM 08/28/2022   11:20 AM 07/19/2022    8:49 AM  PHQ 2/9 Scores  PHQ - 2 Score 1 0 0 0 0 0 0  PHQ- 9 Score 1 0 0 3 0 0     Fall Risk     07/25/2023    8:54 AM 07/11/2023    7:56 AM 02/28/2023    8:07 AM 02/04/2023   11:04 AM 09/25/2022    1:00 PM  Fall Risk   Falls in the past year? 0 0 0 0 0  Number falls in past yr: 0 0 0 0 0  Injury with Fall? 0 0 0 0 0  Risk for fall due to : No Fall Risks No Fall Risks  No Fall Risks   Follow up Falls prevention discussed;Falls evaluation completed Falls evaluation completed  Falls evaluation completed     MEDICARE RISK AT HOME:  Medicare Risk at Home Any stairs in or  around the home?: Yes If so, are there any without handrails?: No Home free of loose throw rugs in walkways, pet beds, electrical cords, etc?: Yes Adequate lighting in your home to reduce risk of falls?: Yes Life alert?: No Use of a cane, walker or w/c?: No Grab bars in the bathroom?: Yes Shower chair or bench in shower?: No Elevated toilet seat or a handicapped toilet?: Yes  TIMED UP AND GO:  Was the test performed?  No  Cognitive Function: 6CIT completed        07/25/2023    8:56 AM 07/19/2022    8:54 AM  6CIT Screen  What Year? 0 points 0 points  What month? 0 points 0 points  What time? 0 points 0 points  Count back from 20 0 points 0 points  Months in reverse 0 points 0 points  Repeat phrase 0 points 0 points  Total Score 0 points 0 points    Immunizations Immunization History  Administered Date(s) Administered   Hepatitis B, ADULT 08/24/2015, 09/28/2015, 03/05/2016   PFIZER(Purple Top)SARS-COV-2 Vaccination 05/30/2019, 06/22/2019, 04/12/2020   PPD Test 08/22/2015   Tdap 02/25/2014    Screening Tests Health Maintenance  Topic Date Due   Zoster Vaccines- Shingrix (1 of 2) Never done   COVID-19 Vaccine (4 - 2024-25 season) 12/23/2022   Pneumonia Vaccine 79+ Years old (1 of 1 - PCV) 02/28/2024 (Originally 05/30/2015)   INFLUENZA VACCINE  11/22/2023   DTaP/Tdap/Td (2 - Td or Tdap) 02/26/2024  MAMMOGRAM  05/08/2024   Fecal DNA (Cologuard)  06/04/2024   Medicare Annual Wellness (AWV)  07/24/2024   DEXA SCAN  Completed   Hepatitis C Screening  Completed   HPV VACCINES  Aged Out   Colonoscopy  Discontinued    Health Maintenance  Health Maintenance Due  Topic Date Due   Zoster Vaccines- Shingrix (1 of 2) Never done   COVID-19 Vaccine (4 - 2024-25 season) 12/23/2022   Health Maintenance Items Addressed: DEXA ordered, MAMMOGRAM & COLOGUARD UP TO DATE, DECLINES ALL COVID, SHINGRIX, FLU & PNA SHOTS  Additional Screening:  Vision Screening: Recommended annual  ophthalmology exams for early detection of glaucoma and other disorders of the eye.  Dental Screening: Recommended annual dental exams for proper oral hygiene  Community Resource Referral / Chronic Care Management: CRR required this visit?  No   CCM required this visit?  No     Plan:     I have personally reviewed and noted the following in the patient's chart:   Medical and social history Use of alcohol, tobacco or illicit drugs  Current medications and supplements including opioid prescriptions. Patient is not currently taking opioid prescriptions. Functional ability and status Nutritional status Physical activity Advanced directives List of other physicians Hospitalizations, surgeries, and ER visits in previous 12 months Vitals Screenings to include cognitive, depression, and falls Referrals and appointments  In addition, I have reviewed and discussed with patient certain preventive protocols, quality metrics, and best practice recommendations. A written personalized care plan for preventive services as well as general preventive health recommendations were provided to patient.     Hal Hope, LPN   04/28/1094   After Visit Summary: (MyChart) Due to this being a telephonic visit, the after visit summary with patients personalized plan was offered to patient via MyChart   Notes:  DEXA REFERRAL SENT

## 2023-07-25 NOTE — Patient Instructions (Addendum)
 Ms. Seeney , Thank you for taking time to come for your Medicare Wellness Visit. I appreciate your ongoing commitment to your health goals. Please review the following plan we discussed and let me know if I can assist you in the future.   Referrals/Orders/Follow-Ups/Clinician Recommendations: BONE DENSITY SCAN REFERRAL SENT  You have an order for:  []   2D Mammogram  []   3D Mammogram  [x]   Bone Density     Please call for appointment:  [facility]  [address] [place] [address] [phone] [facility] [address] [place], [place] [address] [phone] [facility] [address] Geanie Logan [place], [place] [address] [phone]   Make sure to wear two-piece clothing.  No lotions, powders, or deodorants the day of the appointment. Make sure to bring picture ID and insurance card.  Bring list of medications you are currently taking including any supplements.   Schedule your [place] screening mammogram through MyChart!   Log into your MyChart account.  Go to 'Visit' (or 'Appointments' if on mobile App) --> Schedule an Appointment  Under 'Select a Reason for Visit' choose the Mammogram Screening option.  Complete the pre-visit questions and select the time and place that best fits your schedule.   This is a list of the screening recommended for you and due dates:  Health Maintenance  Topic Date Due   Zoster (Shingles) Vaccine (1 of 2) Never done   COVID-19 Vaccine (4 - 2024-25 season) 12/23/2022   Pneumonia Vaccine (1 of 1 - PCV) 02/28/2024*   Flu Shot  11/22/2023   DTaP/Tdap/Td vaccine (2 - Td or Tdap) 02/26/2024   Mammogram  05/08/2024   Cologuard (Stool DNA test)  06/04/2024   Medicare Annual Wellness Visit  07/24/2024   DEXA scan (bone density measurement)  Completed   Hepatitis C Screening  Completed   HPV Vaccine  Aged Out   Colon  Cancer Screening  Discontinued  *Topic was postponed. The date shown is not the original due date.    Advanced directives: (ACP Link)Information on Advanced Care Planning can be found at [facility] Advance Health Care Directives Advance Health Care Directives. [email or web address]   Next Medicare Annual Wellness Visit scheduled for next year: Yes  07/30/24 @ 8:50 AM BY PHONE

## 2023-09-02 DIAGNOSIS — H25013 Cortical age-related cataract, bilateral: Secondary | ICD-10-CM | POA: Diagnosis not present

## 2023-09-02 DIAGNOSIS — H2513 Age-related nuclear cataract, bilateral: Secondary | ICD-10-CM | POA: Diagnosis not present

## 2023-09-02 DIAGNOSIS — H04201 Unspecified epiphora, right lacrimal gland: Secondary | ICD-10-CM | POA: Diagnosis not present

## 2023-09-24 ENCOUNTER — Ambulatory Visit
Admission: RE | Admit: 2023-09-24 | Discharge: 2023-09-24 | Disposition: A | Discharge status: Home / Self Care | Source: Ambulatory Visit | Attending: Internal Medicine | Admitting: Internal Medicine

## 2023-09-24 ENCOUNTER — Encounter: Payer: Self-pay | Admitting: Internal Medicine

## 2023-09-24 ENCOUNTER — Ambulatory Visit (INDEPENDENT_AMBULATORY_CARE_PROVIDER_SITE_OTHER): Admitting: Internal Medicine

## 2023-09-24 ENCOUNTER — Other Ambulatory Visit: Payer: Self-pay

## 2023-09-24 VITALS — BP 112/74 | HR 82 | Temp 97.7°F | Resp 18 | Ht 65.0 in | Wt 241.0 lb

## 2023-09-24 DIAGNOSIS — R062 Wheezing: Secondary | ICD-10-CM

## 2023-09-24 DIAGNOSIS — R052 Subacute cough: Secondary | ICD-10-CM

## 2023-09-24 DIAGNOSIS — J3489 Other specified disorders of nose and nasal sinuses: Secondary | ICD-10-CM

## 2023-09-24 MED ORDER — BENZONATATE 100 MG PO CAPS
100.0000 mg | ORAL_CAPSULE | Freq: Two times a day (BID) | ORAL | 0 refills | Status: DC | PRN
Start: 2023-09-24 — End: 2024-03-04

## 2023-09-24 MED ORDER — METHYLPREDNISOLONE 4 MG PO TBPK
ORAL_TABLET | ORAL | 0 refills | Status: DC
Start: 2023-09-24 — End: 2024-03-04

## 2023-09-24 MED ORDER — ALBUTEROL SULFATE HFA 108 (90 BASE) MCG/ACT IN AERS
2.0000 | INHALATION_SPRAY | Freq: Four times a day (QID) | RESPIRATORY_TRACT | 2 refills | Status: DC | PRN
Start: 2023-09-24 — End: 2024-03-04

## 2023-09-24 NOTE — Progress Notes (Signed)
 Acute Office Visit  Subjective:     Patient ID: Sherry Lowe, female    DOB: 05-30-1950, 73 y.o.   MRN: [id number]  Chief Complaint  Patient presents with   Cough    On and off for 1 month but started back 5 days ago    Cough Associated symptoms include wheezing. Pertinent negatives include no chills, fever, sore throat or shortness of breath.   Patient is in today for cough.   Discussed the use of AI scribe software for clinical note transcription with the patient, who gave verbal consent to proceed.  History of Present Illness Sherry Lowe is a 73 year old female who presents with a persistent cough and wheezing.  She experiences a persistent, deep cough with intermittent wheezing, which was absent the night before the visit. Mucinex is used, typically once daily, providing some relief. Hot beverages significantly alleviate symptoms. The cough initially improved but returned with recent weather changes, persisting for two to four days with difficulty expectorating mucus. A similar episode occurred a month ago. She denies shortness of breath and fever.  Mucus is felt more in the sinuses than in the lungs, with throat and chest discomfort from attempts to clear it. Over-the-counter medications like Claritin  are occasionally used for allergies. The cough is more pronounced at night and worsens with weather changes.   Review of Systems  Constitutional:  Negative for chills and fever.  HENT:  Negative for congestion, sinus pain and sore throat.   Respiratory:  Positive for cough and wheezing. Negative for sputum production and shortness of breath.         Objective:    BP 112/74 (Cuff Size: Large)   Pulse 82   Temp 97.7 F (36.5 C) (Oral)   Resp 18   Ht 5\' 5"  (1.651 m)   Wt 241 lb (109.3 kg)   SpO2 97%   BMI 40.10 kg/m    Physical Exam Constitutional:      Appearance: Normal appearance.  HENT:     Head: Normocephalic and atraumatic.     Mouth/Throat:      Mouth: Mucous membranes are moist.     Pharynx: Oropharynx is clear.  Eyes:     Conjunctiva/sclera: Conjunctivae normal.  Cardiovascular:     Rate and Rhythm: Normal rate and regular rhythm.  Pulmonary:     Effort: Pulmonary effort is normal.     Breath sounds: Wheezing present.     Comments: Expiratory wheezing throughout  Neurological:     Mental Status: She is alert.     No results found for any visits on 09/24/23.      Assessment & Plan:   Assessment & Plan Cough with Wheezing Persistent cough and wheezing, likely allergy-related with airway inflammation. Concern for bacterial pneumonia due to recurrence of symptoms. - Order chest x-ray to rule out bacterial pneumonia. - Prescribe albuterol inhaler every 4-6 hours as needed. - Prescribe oral steroid pack to reduce lung inflammation. - Prescribe nighttime cough suppressant if needed. - Advise Mucinex in the morning, increase to 1200 mg if needed. - Recommend humidifier or steam inhalation.  Sinus Drainage Sinus drainage contributing to throat mucus sensation and possibly to cough and wheezing. - Continue daily Claritin  or another antihistamine. - Consider sinus rinses or nasal spray, despite hesitance. - Use oral steroid pack to reduce sinus inflammation.  - DG Chest 2 View; Future - benzonatate (TESSALON) 100 MG capsule; Take 1 capsule (100 mg total) by mouth 2 (two)  times daily as needed for cough.  Dispense: 20 capsule; Refill: 0 - albuterol (VENTOLIN HFA) 108 (90 Base) MCG/ACT inhaler; Inhale 2 puffs into the lungs every 6 (six) hours as needed for wheezing or shortness of breath.  Dispense: 8 g; Refill: 2 - methylPREDNISolone (MEDROL DOSEPAK) 4 MG TBPK tablet; Use as directed.  Dispense: 21 each; Refill: 0    Return if symptoms worsen or fail to improve.  Rockney Cid, DO

## 2023-09-27 ENCOUNTER — Encounter: Payer: Self-pay | Admitting: Internal Medicine

## 2023-09-30 ENCOUNTER — Ambulatory Visit: Payer: Self-pay | Admitting: Internal Medicine

## 2024-03-04 ENCOUNTER — Encounter: Payer: Self-pay | Admitting: Internal Medicine

## 2024-03-04 ENCOUNTER — Other Ambulatory Visit: Payer: Self-pay

## 2024-03-04 ENCOUNTER — Ambulatory Visit: Payer: Self-pay | Admitting: Internal Medicine

## 2024-03-04 VITALS — BP 130/78 | HR 88 | Temp 98.3°F | Resp 16 | Ht 65.0 in | Wt 245.4 lb

## 2024-03-04 DIAGNOSIS — E559 Vitamin D deficiency, unspecified: Secondary | ICD-10-CM

## 2024-03-04 DIAGNOSIS — Z13220 Encounter for screening for lipoid disorders: Secondary | ICD-10-CM | POA: Diagnosis not present

## 2024-03-04 DIAGNOSIS — Z1231 Encounter for screening mammogram for malignant neoplasm of breast: Secondary | ICD-10-CM | POA: Diagnosis not present

## 2024-03-04 DIAGNOSIS — J301 Allergic rhinitis due to pollen: Secondary | ICD-10-CM | POA: Diagnosis not present

## 2024-03-04 DIAGNOSIS — Z0000 Encounter for general adult medical examination without abnormal findings: Secondary | ICD-10-CM | POA: Diagnosis not present

## 2024-03-04 DIAGNOSIS — M170 Bilateral primary osteoarthritis of knee: Secondary | ICD-10-CM

## 2024-03-04 LAB — CBC WITH DIFFERENTIAL/PLATELET
Absolute Lymphocytes: 1623 {cells}/uL (ref 850–3900)
Absolute Monocytes: 550 {cells}/uL (ref 200–950)
Basophils Absolute: 28 {cells}/uL (ref 0–200)
Basophils Relative: 0.5 %
Eosinophils Absolute: 110 {cells}/uL (ref 15–500)
Eosinophils Relative: 2 %
HCT: 42.2 % (ref 35.0–45.0)
Hemoglobin: 13.3 g/dL (ref 11.7–15.5)
MCH: 27.5 pg (ref 27.0–33.0)
MCHC: 31.5 g/dL — ABNORMAL LOW (ref 32.0–36.0)
MCV: 87.2 fL (ref 80.0–100.0)
MPV: 9.9 fL (ref 7.5–12.5)
Monocytes Relative: 10 %
Neutro Abs: 3190 {cells}/uL (ref 1500–7800)
Neutrophils Relative %: 58 %
Platelets: 392 Thousand/uL (ref 140–400)
RBC: 4.84 Million/uL (ref 3.80–5.10)
RDW: 13.2 % (ref 11.0–15.0)
Total Lymphocyte: 29.5 %
WBC: 5.5 Thousand/uL (ref 3.8–10.8)

## 2024-03-04 LAB — COMPREHENSIVE METABOLIC PANEL WITH GFR
AG Ratio: 1.7 (calc) (ref 1.0–2.5)
ALT: 10 U/L (ref 6–29)
AST: 15 U/L (ref 10–35)
Albumin: 4.3 g/dL (ref 3.6–5.1)
Alkaline phosphatase (APISO): 90 U/L (ref 37–153)
BUN: 20 mg/dL (ref 7–25)
CO2: 24 mmol/L (ref 20–32)
Calcium: 9.9 mg/dL (ref 8.6–10.4)
Chloride: 106 mmol/L (ref 98–110)
Creat: 0.97 mg/dL (ref 0.60–1.00)
Globulin: 2.6 g/dL (ref 1.9–3.7)
Glucose, Bld: 94 mg/dL (ref 65–99)
Potassium: 4.4 mmol/L (ref 3.5–5.3)
Sodium: 141 mmol/L (ref 135–146)
Total Bilirubin: 0.8 mg/dL (ref 0.2–1.2)
Total Protein: 6.9 g/dL (ref 6.1–8.1)
eGFR: 62 mL/min/1.73m2 (ref >=60)

## 2024-03-04 LAB — LIPID PANEL
Cholesterol: 239 mg/dL — ABNORMAL HIGH (ref <200)
HDL: 87 mg/dL (ref >=50)
LDL Cholesterol (Calc): 128 mg/dL — ABNORMAL HIGH
Non-HDL Cholesterol (Calc): 152 mg/dL — ABNORMAL HIGH (ref <130)
Total CHOL/HDL Ratio: 2.7 (calc) (ref <5.0)
Triglycerides: 125 mg/dL (ref <150)

## 2024-03-04 LAB — VITAMIN D 25 HYDROXY (VIT D DEFICIENCY, FRACTURES): Vit D, 25-Hydroxy: 16 ng/mL — ABNORMAL LOW (ref 30–100)

## 2024-03-04 NOTE — Progress Notes (Signed)
 Name: KARALYNE NUSSER   MRN: [id number]    DOB: September 29, 1950   Date:03/04/2024       Progress Note  Subjective  Chief Complaint  Chief Complaint  Patient presents with   Annual Exam    HPI  Patient presents for annual CPE.  Discussed the use of AI scribe software for clinical note transcription with the patient, who gave verbal consent to proceed.  History of Present Illness DEVINA BEZOLD is a 73 year old female who presents with bilateral knee pain and swelling.  She experiences bilateral knee pain, particularly across the front, which is more pronounced when she begins walking but improves with continued movement. The knees are initially stiff, but the stiffness lessens as she moves. She has received a steroid injection in one knee and attended physical therapy for a year, which was discontinued due to insurance limitations. The pain now affects both knees, and there is swelling in the knees and legs, which subsides overnight.  A previous diagnosis of a small meniscus tear in one knee was treated with a steroid injection. Her last physical therapy and steroid injection were over a year and a half ago. She experiences a sensation of instability when descending stairs and feels that her knees might give out.  For pain management, she takes Tylenol  at night, which helps her in the morning. She also uses Advil as needed and prefers to take the least amount necessary to manage her symptoms. Her legs swell during the day, especially when sitting at work, but appear normal in the morning. She has no smoking history.   Diet:Regular Exercise: 1 day minutes 30 minutes  Last Eye Exam: completed Last Dental Exam: completed  Flowsheet Row Office Visit from 03/04/2024 in [facility]  AUDIT-C Score 0   Depression: Phq 9 is  negative    03/04/2024    8:07 AM 07/25/2023    8:51 AM 02/28/2023    8:07 AM 02/05/2023    2:55 PM 02/04/2023   11:04 AM  Depression screen PHQ  2/9  Decreased Interest 0 0 0 0 0  Down, Depressed, Hopeless 0 1 0 0 0  PHQ - 2 Score 0 1 0 0 0  Altered sleeping  0 0 0 1  Tired, decreased energy  0 0 0 1  Change in appetite  0 0 0 1  Feeling bad or failure about yourself   0 0 0 0  Trouble concentrating  0 0 0 0  Moving slowly or fidgety/restless  0 0 0 0  Suicidal thoughts  0 0 0 0  PHQ-9 Score  1  0  0  3   Difficult doing work/chores  Not difficult at all Not difficult at all Not difficult at all Not difficult at all     Data saved with a previous flowsheet row definition   Hypertension: BP Readings from Last 3 Encounters:  03/04/24 130/78  09/24/23 112/74  07/11/23 128/82   Obesity: Wt Readings from Last 3 Encounters:  03/04/24 245 lb 6.4 oz (111.3 kg)  09/24/23 241 lb (109.3 kg)  07/11/23 242 lb 3.2 oz (109.9 kg)   BMI Readings from Last 3 Encounters:  03/04/24 40.84 kg/m  09/24/23 40.10 kg/m  07/11/23 40.30 kg/m     Vaccines: reviewed with the patient. Patient declines all vaccines although we did discuss Prevnar 20 and I printed info for her to review.   Hep C Screening: completed STD testing and prevention (HIV/chl/gon/syphilis): NA  Intimate partner violence: negative screen  Sexual History :Not active Menstrual History/LMP/Abnormal Bleeding: Normal Discussed importance of follow up if any post-menopausal bleeding: not applicable  Incontinence Symptoms: negative for symptoms   Breast cancer:  - Last Mammogram: 05/09/2023  Osteoporosis Prevention : Discussed high calcium and vitamin D supplementation, weight bearing exercises Bone density :will schedule  Cervical cancer screening: not applicable due to hysterectomy  Skin cancer: Discussed monitoring for atypical lesions  Colorectal cancer: 06/04/2021 Cologuard negative   Lung cancer:  Low Dose CT Chest recommended if Age 73-80 years, 20 pack-year currently smoking OR have quit w/in 15years. Patient does not qualify for screen   ECG:  01/30/2023  Advanced Care Planning: A voluntary discussion about advance care planning including the explanation and discussion of advance directives.  Discussed health care proxy and Living will, and the patient was able to identify a health care proxy as Rolin Gin (niece).  Patient does have a living will and power of attorney of health care   Patient Active Problem List   Diagnosis Date Noted   Acute colitis 01/30/2023   Screen for colon cancer 12/25/2016   Vaccine refused by patient 12/20/2016   Vaccine counseling 08/18/2015   Screening for condition 08/18/2015   Screen for STD (sexually transmitted disease) 08/18/2015   Encounter for health maintenance examination in adult 08/18/2015   Routine general medical examination at a health care facility 10/29/2011   HTN (hypertension) 04/20/2011   Obesity (BMI 30-39.9) 04/20/2011    Past Surgical History:  Procedure Laterality Date   CARDIAC CATHETERIZATION  1995   reportedly normal per patient.     COLONOSCOPY  2013   normal per patient   VAGINAL HYSTERECTOMY  1985   total, due to fibroids   WISDOM TOOTH EXTRACTION      Family History  Problem Relation Age of Onset   Hyperlipidemia Father        Living, 12   Cancer Father        prostate   Hypertension Father    Hyperlipidemia Mother        Living 43   Healthy Brother        x2   Cancer Maternal Grandmother        stomach   Stroke Maternal Grandfather     Social History   Socioeconomic History   Marital status: Divorced    Spouse name: Not on file   Number of children: 1   Years of education: Not on file   Highest education level: Bachelor's degree (e.g., BA, AB, BS)  Occupational History   Not on file  Tobacco Use   Smoking status: Never   Smokeless tobacco: Never  Vaping Use   Vaping status: Never Used  Substance and Sexual Activity   Alcohol use: No   Drug use: No   Sexual activity: Yes    Partners: Male  Other Topics Concern   Not on file   Social History Narrative   She lives alone.  She has one grown son.   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      Was prior working as a arts development officer as a Acupuncturist.     Works in educational psychologist at [employer] level of education:  B.S.   [employer]   Social Drivers of Health   Financial Resource Strain: Low Risk  (02/27/2024)   Overall Financial Resource Strain (CARDIA)    Difficulty of Paying Living Expenses:  Not very hard  Food Insecurity: No Food Insecurity (02/27/2024)   Hunger Vital Sign    Worried About Running Out of Food in the Last Year: Never true    Ran Out of Food in the Last Year: Never true  Transportation Needs: No Transportation Needs (03/04/2024)   PRAPARE - Administrator, Civil Service (Medical): No    Lack of Transportation (Non-Medical): No  Physical Activity: Inactive (02/27/2024)   Exercise Vital Sign    Days of Exercise per Week: 0 days    Minutes of Exercise per Session: Not on file  Stress: No Stress Concern Present (02/27/2024)   [employer] of Occupational Health - Occupational Stress Questionnaire    Feeling of Stress: Not at all  Social Connections: Moderately Integrated (02/27/2024)   Social Connection and Isolation Panel    Frequency of Communication with Friends and Family: More than three times a week    Frequency of Social Gatherings with Friends and Family: More than three times a week    Attends Religious Services: More than 4 times per year    Active Member of [employer] or Organizations: Yes    Attends Banker Meetings: More than 4 times per year    Marital Status: Divorced  Intimate Partner Violence: Unknown (03/04/2024)   Humiliation, Afraid, Rape, and Kick questionnaire    Fear of Current or Ex-Partner: No    Emotionally Abused: No    Physically Abused: No    Sexually Abused: Not on file     Current Outpatient Medications:    albuterol  (VENTOLIN  HFA) 108  (90 Base) MCG/ACT inhaler, Inhale 2 puffs into the lungs every 6 (six) hours as needed for wheezing or shortness of breath., Disp: 8 g, Rfl: 2   benzonatate  (TESSALON ) 100 MG capsule, Take 1 capsule (100 mg total) by mouth 2 (two) times daily as needed for cough., Disp: 20 capsule, Rfl: 0   methylPREDNISolone  (MEDROL  DOSEPAK) 4 MG TBPK tablet, Use as directed., Disp: 21 each, Rfl: 0  Allergies  Allergen Reactions   Alprazolam Hives   Sulfa Antibiotics Hives   Sulfa Drugs Cross Reactors Itching   Zithromax [Azithromycin]      Review of Systems  Musculoskeletal:  Positive for joint pain.    Objective  Vitals:   03/04/24 0809  BP: 130/78  Pulse: 88  Resp: 16  Temp: 98.3 F (36.8 C)  TempSrc: Oral  SpO2: 98%  Weight: 245 lb 6.4 oz (111.3 kg)  Height: 5' 5 (1.651 m)    Body mass index is 40.84 kg/m.  Physical Exam Constitutional:      Appearance: Normal appearance.  HENT:     Head: Normocephalic and atraumatic.     Mouth/Throat:     Mouth: Mucous membranes are moist.     Pharynx: Oropharynx is clear.  Eyes:     Extraocular Movements: Extraocular movements intact.     Conjunctiva/sclera: Conjunctivae normal.     Pupils: Pupils are equal, round, and reactive to light.  Neck:     Comments: No thyromegaly Cardiovascular:     Rate and Rhythm: Normal rate and regular rhythm.  Pulmonary:     Effort: Pulmonary effort is normal.     Breath sounds: Normal breath sounds.  Musculoskeletal:        General: Tenderness present. No swelling.     Cervical back: No tenderness.     Right lower leg: No edema.     Left lower leg: No edema.  Lymphadenopathy:  Cervical: No cervical adenopathy.  Skin:    General: Skin is warm and dry.  Neurological:     General: No focal deficit present.     Mental Status: She is alert. Mental status is at baseline.  Psychiatric:        Mood and Affect: Mood normal.        Behavior: Behavior normal.     Last CBC Lab Results  Component  Value Date   WBC 8.0 02/01/2023   HGB 13.6 02/01/2023   HCT 41.8 02/01/2023   MCV 84.8 02/01/2023   MCH 27.6 02/01/2023   RDW 13.6 02/01/2023   PLT 342 02/01/2023   Last metabolic panel Lab Results  Component Value Date   GLUCOSE 115 (H) 02/01/2023   NA 137 02/01/2023   K 3.7 02/01/2023   CL 104 02/01/2023   CO2 23 02/01/2023   BUN 11 02/01/2023   CREATININE 0.99 02/01/2023   GFRNONAA >60 02/01/2023   CALCIUM 9.0 02/01/2023   PHOS 2.7 02/01/2023   PROT 7.1 01/31/2023   ALBUMIN 3.7 01/31/2023   LABGLOB 2.3 06/20/2018   AGRATIO 1.9 06/20/2018   BILITOT 1.1 01/31/2023   ALKPHOS 77 01/31/2023   AST 17 01/31/2023   ALT 13 01/31/2023   ANIONGAP 10 02/01/2023   Last lipids Lab Results  Component Value Date   CHOL 229 (H) 05/24/2021   HDL 81 05/24/2021   LDLCALC 125 (H) 05/24/2021   LDLDIRECT 120.7 10/19/2011   TRIG 122 05/24/2021   CHOLHDL 2.8 05/24/2021   Last hemoglobin A1c Lab Results  Component Value Date   HGBA1C 5.4 08/28/2022   Last thyroid functions Lab Results  Component Value Date   TSH 1.763 02/25/2014   Last vitamin D No results found for: 25OHVITD2, 25OHVITD3, VD25OH Last vitamin B12 and Folate No results found for: VITAMINB12, FOLATE    Assessment & Plan  Assessment & Plan Adult Wellness Visit Annual wellness visit completed. Discussed pneumonia vaccine benefits. - Ordered labs: kidney function, liver function, electrolytes, anemia screening, cholesterol, vitamin D. - Printed information about pneumonia vaccine (Prevnar 20). - Schedule mammogram and bone density scan for next year.  Bilateral knee osteoarthritis Mild to moderate osteoarthritis with stiffness and pain. Managed with Tylenol . No surgery or further steroid injections needed. Discussed anti-inflammatories and topical treatments. - Recommend Advil as needed with food, minimal effective dose. - Suggest Voltaren gel for localized pain. - Advise alternating Tylenol  and  Advil. - Instruct to contact if symptoms worsen or management insufficient.  Lower extremity edema Intermittent edema, likely due to prolonged sitting and wearing heels. Resolves overnight.  Allergic rhinitis Intermittent symptoms likely due to weather and allergens. - Continue Claritin  as needed.  - CBC w/Diff/Platelet - Comprehensive Metabolic Panel (CMET) - Lipid Profile - Vitamin D (25 hydroxy) - MM 3D SCREENING MAMMOGRAM BILATERAL BREAST; Future  -USPSTF grade A and B recommendations reviewed with patient; age-appropriate recommendations, preventive care, screening tests, etc discussed and encouraged; healthy living encouraged; see AVS for patient education given to patient -Discussed importance of 150 minutes of physical activity weekly, eat two servings of fish weekly, eat one serving of tree nuts ( cashews, pistachios, pecans, almonds.SABRA) every other day, eat 6 servings of fruit/vegetables daily and drink plenty of water and avoid sweet beverages.   -Reviewed Health Maintenance: Yes.

## 2024-03-04 NOTE — Patient Instructions (Signed)
Pneumococcal Conjugate Vaccine (PCV20) Injection What is this medication? PNEUMOCOCCAL CONJUGATE VACCINE (NEU mo KOK al kon ju gate vak SEEN) reduces the risk of pneumococcal disease, such as pneumonia. It does not treat pneumococcal disease. It is still possible to get pneumococcal disease after receiving this vaccine, but the symptoms may be less severe or not last as long. It works by helping your immune system learn how to fight off a future infection. This medicine may be used for other purposes; ask your health care provider or pharmacist if you have questions. COMMON BRAND NAME(S): Prevnar 20 What should I tell my care team before I take this medication? They need to know if you have any of these conditions: Bleeding disorder Fever Immune system problems An unusual or allergic reaction to pneumococcal vaccine, diphtheria toxoid, other vaccines, other medications, foods, dyes, or preservatives Pregnant or trying to get pregnant Breastfeeding How should I use this medication? This vaccine is injected into a muscle. It is given by your care team. A copy of Vaccine Information Statements will be given before each vaccination. Be sure to read this information carefully each time. This sheet may change often. Talk to your care team about the use of this medication in children. While it may be given to children as young as 6 weeks for selected conditions, precautions do apply. Overdosage: If you think you have taken too much of this medicine contact a poison control center or emergency room at once. NOTE: This medicine is only for you. Do not share this medicine with others. What if I miss a dose? This does not apply. This medication is not for regular use. What may interact with this medication? Medications for cancer chemotherapy Medications that suppress your immune function Steroid medications, such as prednisone or cortisone This list may not describe all possible interactions. Give  your health care provider a list of all the medicines, herbs, non-prescription drugs, or dietary supplements you use. Also tell them if you smoke, drink alcohol, or use illegal drugs. Some items may interact with your medicine. What should I watch for while using this medication? Visit your care team regularly. Report any side effects to your care team right away. This vaccine, like all vaccines, may not fully protect everyone. What side effects may I notice from receiving this medication? Side effects that you should report to your care team as soon as possible: Allergic reactions--skin rash, itching, hives, swelling of the face, lips, tongue, or throat Side effects that usually do not require medical attention (report these to your care team if they continue or are bothersome): Fatigue Fever Headache Joint pain Muscle pain Pain, redness, or irritation at injection site This list may not describe all possible side effects. Call your doctor for medical advice about side effects. You may report side effects to FDA at 1-800-FDA-1088. Where should I keep my medication? This vaccine is only given by your care team. It will not be stored at home. NOTE: This sheet is a summary. It may not cover all possible information. If you have questions about this medicine, talk to your doctor, pharmacist, or health care provider.  2024 Elsevier/Gold Standard (2021-09-20 00:00:00)

## 2024-03-05 ENCOUNTER — Ambulatory Visit: Payer: Self-pay | Admitting: Internal Medicine

## 2024-03-05 DIAGNOSIS — E559 Vitamin D deficiency, unspecified: Secondary | ICD-10-CM

## 2024-03-05 MED ORDER — VITAMIN D (ERGOCALCIFEROL) 1.25 MG (50000 UNIT) PO CAPS
50000.0000 [IU] | ORAL_CAPSULE | ORAL | 0 refills | Status: AC
Start: 2024-03-05 — End: ?

## 2024-05-21 ENCOUNTER — Encounter: Payer: Self-pay | Admitting: Internal Medicine

## 2024-06-23 ENCOUNTER — Ambulatory Visit

## 2024-07-30 ENCOUNTER — Ambulatory Visit

## 2025-03-05 ENCOUNTER — Encounter: Admitting: Internal Medicine
